# Patient Record
Sex: Female | Born: 1980 | Race: Black or African American | Hispanic: No | Marital: Married | Smoking: Never smoker
Health system: Southern US, Community
[De-identification: ages and names within clinical notes are randomized; demographics above are authoritative.]

## PROBLEM LIST (undated history)

## (undated) ENCOUNTER — Inpatient Hospital Stay (HOSPITAL_COMMUNITY): Payer: Self-pay

## (undated) DIAGNOSIS — D219 Benign neoplasm of connective and other soft tissue, unspecified: Secondary | ICD-10-CM

## (undated) DIAGNOSIS — J45909 Unspecified asthma, uncomplicated: Secondary | ICD-10-CM

## (undated) DIAGNOSIS — D649 Anemia, unspecified: Secondary | ICD-10-CM

## (undated) DIAGNOSIS — K219 Gastro-esophageal reflux disease without esophagitis: Secondary | ICD-10-CM

## (undated) HISTORY — PX: NO PAST SURGERIES: SHX2092

---

## 2005-08-02 ENCOUNTER — Emergency Department (HOSPITAL_COMMUNITY): Admission: EM | Admit: 2005-08-02 | Discharge: 2005-08-02 | Payer: Self-pay | Admitting: Emergency Medicine

## 2008-11-24 ENCOUNTER — Emergency Department (HOSPITAL_COMMUNITY): Admission: EM | Admit: 2008-11-24 | Discharge: 2008-11-24 | Payer: Self-pay | Admitting: Family Medicine

## 2009-08-17 ENCOUNTER — Emergency Department (HOSPITAL_COMMUNITY): Admission: EM | Admit: 2009-08-17 | Discharge: 2009-08-17 | Payer: Self-pay | Admitting: Emergency Medicine

## 2010-03-19 ENCOUNTER — Emergency Department (HOSPITAL_COMMUNITY): Admission: EM | Admit: 2010-03-19 | Discharge: 2010-03-20 | Payer: Self-pay | Admitting: Emergency Medicine

## 2010-09-06 LAB — POCT URINALYSIS DIP (DEVICE)
Glucose, UA: NEGATIVE mg/dL
Hgb urine dipstick: NEGATIVE
Ketones, ur: 40 mg/dL — AB
Nitrite: NEGATIVE
Protein, ur: 30 mg/dL — AB
Specific Gravity, Urine: >=1.03 (ref 1.005–1.030)
Urobilinogen, UA: 0.2 mg/dL (ref 0.0–1.0)
pH: 5 (ref 5.0–8.0)

## 2010-09-06 LAB — POCT PREGNANCY, URINE: Preg Test, Ur: NEGATIVE

## 2011-03-08 ENCOUNTER — Other Ambulatory Visit (HOSPITAL_COMMUNITY)
Admission: RE | Admit: 2011-03-08 | Discharge: 2011-03-08 | Disposition: A | Discharge status: Home / Self Care | Payer: BC Managed Care – PPO | Source: Ambulatory Visit | Attending: Obstetrics and Gynecology | Admitting: Obstetrics and Gynecology

## 2011-03-08 ENCOUNTER — Other Ambulatory Visit: Payer: Self-pay | Admitting: Nurse Practitioner

## 2011-03-08 DIAGNOSIS — Z01419 Encounter for gynecological examination (general) (routine) without abnormal findings: Secondary | ICD-10-CM | POA: Insufficient documentation

## 2011-03-08 DIAGNOSIS — N760 Acute vaginitis: Secondary | ICD-10-CM | POA: Insufficient documentation

## 2012-10-05 ENCOUNTER — Emergency Department (HOSPITAL_COMMUNITY)
Admission: EM | Admit: 2012-10-05 | Discharge: 2012-10-05 | Disposition: A | Discharge status: Home Health | Payer: BC Managed Care – PPO | Attending: Emergency Medicine | Admitting: Emergency Medicine

## 2012-10-05 ENCOUNTER — Encounter (HOSPITAL_COMMUNITY): Payer: Self-pay | Admitting: Family Medicine

## 2012-10-05 DIAGNOSIS — J45901 Unspecified asthma with (acute) exacerbation: Secondary | ICD-10-CM | POA: Insufficient documentation

## 2012-10-05 DIAGNOSIS — J45909 Unspecified asthma, uncomplicated: Secondary | ICD-10-CM

## 2012-10-05 DIAGNOSIS — R064 Hyperventilation: Secondary | ICD-10-CM

## 2012-10-05 HISTORY — DX: Unspecified asthma, uncomplicated: J45.909

## 2012-10-05 MED ORDER — ALBUTEROL SULFATE (5 MG/ML) 0.5% IN NEBU
5.0000 mg | INHALATION_SOLUTION | Freq: Once | RESPIRATORY_TRACT | Status: AC
  Administered 2012-10-05: 5 mg via RESPIRATORY_TRACT

## 2012-10-05 MED ORDER — ALBUTEROL SULFATE (5 MG/ML) 0.5% IN NEBU
INHALATION_SOLUTION | RESPIRATORY_TRACT | Status: AC
  Filled 2012-10-05: qty 1

## 2012-10-05 MED ORDER — IPRATROPIUM BROMIDE 0.02 % IN SOLN: 0.5000 mg | Freq: Once | RESPIRATORY_TRACT | Status: DC

## 2012-10-05 MED ORDER — IPRATROPIUM BROMIDE 0.02 % IN SOLN
RESPIRATORY_TRACT | Status: AC
  Administered 2012-10-05: 0.5 mg
  Filled 2012-10-05: qty 2.5

## 2012-10-05 MED ORDER — LORAZEPAM 1 MG PO TABS
2.0000 mg | ORAL_TABLET | Freq: Once | ORAL | Status: AC
  Administered 2012-10-05: 2 mg via ORAL
  Filled 2012-10-05: qty 2

## 2012-10-05 MED ORDER — DIAZEPAM 2 MG PO TABS: ORAL_TABLET | ORAL | Status: DC

## 2012-10-05 NOTE — ED Notes (Signed)
Per pt SOB with hx of asthma. Pt not moving any air at triage. Unable to speak but a few words. Pt 100% on RA. Very labored.

## 2012-10-05 NOTE — ED Provider Notes (Signed)
History     CSN: [id number]  Arrival date & time 10/05/12  1308   First MD Initiated Contact with Patient 10/05/12 [phone]      Chief Complaint  Patient presents with  . Asthma    (Consider location/radiation/quality/duration/timing/severity/associated sxs/prior treatment) HPI  32 year old female with a past medical history of asthma presents emergency Department with chief complaint of asthma exacerbation.  Patient denies any root previous hospitalization or intubation.  Her husband initiate history by saying that she has really stressed out over the past few weeks.  They're currently moving.  She works in Training and development officer and her husband feels that she has been under significant stress due to this and is manifesting an asthma exacerbation.  The patient interrupted her nebulizer treatment to let me note that she developed a case of paroxysmal coughing and could not catch her breath this morning.  She used her inhaler several times.  She became extremely short of breath and called her husband to bring her to the hospital.  The patient has a history of seasonal allergies she only uses albuterol inhaler.  She is not on any asthma prevent his head has never been.  She denies any cough, recent upper respiratory infection, fever.  Patient states she is currently nauseated.  Past Medical History  Diagnosis Date  . Asthma     History reviewed. No pertinent past surgical history.  History reviewed. No pertinent family history.  History  Substance Use Topics  . Smoking status: Never Smoker   . Smokeless tobacco: Not on file  . Alcohol Use: No    OB History   Grav Para Term Preterm Abortions TAB SAB Ect Mult Living                  Review of Systems Ten systems reviewed and are negative for acute change, except as noted in the HPI.   Allergies  Review of patient's allergies indicates no known allergies.  Home Medications  No current outpatient prescriptions on file.  BP  116/88  Pulse 76  Resp 22  SpO2 100%  LMP 09/19/2012  Physical Exam Physical Exam  Nursing note and vitals reviewed. Constitutional:  No distress.  Speaks in full sentences, however during exam patient appears to be hyperventilating and bracing the chest causing tachypnea. HENT:  Head: Normocephalic and atraumatic.  Eyes: Conjunctivae normal and EOM are normal. Pupils are equal, round, and reactive to light. No scleral icterus.  Neck: Normal range of motion.  Cardiovascular: Normal rate, regular rhythm and normal heart sounds.  Exam reveals no gallop and no friction rub.   No murmur heard. Pulmonary/Chest: Tachypnea, good air movement.  No wheezing, rales, rhonchi heard.  No accessory muscle. Abdominal: Soft. Bowel sounds are normal. She exhibits no distension and no mass. There is no tenderness. There is no guarding.  Neurological: She is alert and oriented to person, place, and time.  Skin: Skin is warm and dry. She is not diaphoretic.    ED Course  Procedures (including critical care time)  Labs Reviewed - No data to display No results found.   No diagnosis found.    MDM  10:38 AM BP 116/88  Pulse 76  Resp 22  SpO2 100%  LMP 09/19/2012 The patient appears to be hyperventilating.  I do not suspect acute asthma exacerbation.  She is currently getting a nebulizer treatment.  I feel that this is more related to an anxiety attack.  We'll treat the patient with oral Ativan.  Reevaluate shortly    11:48 AM Filed Vitals:   10/05/12 0957 10/05/12 1030 10/05/12 1100  BP: 116/88 132/83 132/79  Pulse: 76 94 81  Resp: 22    SpO2: 100% 100% 97%   On reexamination patient is resting comfortably and breathing easily.  She states she feels much better after treatment and 2 mg of Ativan.  The patient appears safe for discharge.  I will have her followup with her primary care physician.  I am discharging her with a small amount of diazepam for relief of hyperventilation  symptoms.The patient appears reasonably screened and/or stabilized for discharge and I doubt any other medical condition or other [facility] requiring further screening, evaluation, or treatment in the ED at this time prior to discharge.     Arthor Captain, PA-C 10/05/12 1149

## 2012-10-05 NOTE — ED Provider Notes (Signed)
Medical screening examination/treatment/procedure(s) were performed by non-physician practitioner and as supervising physician I was immediately available for consultation/collaboration.    Zyhir Cappella D Sreeja Spies, MD 10/05/12 2139 

## 2013-01-30 ENCOUNTER — Inpatient Hospital Stay (HOSPITAL_COMMUNITY)
Admission: AD | Admit: 2013-01-30 | Discharge: 2013-01-30 | Disposition: A | Discharge status: Home / Self Care | Payer: BC Managed Care – PPO | Source: Ambulatory Visit | Attending: Obstetrics and Gynecology | Admitting: Obstetrics and Gynecology

## 2013-01-30 ENCOUNTER — Inpatient Hospital Stay (HOSPITAL_COMMUNITY): Payer: BC Managed Care – PPO

## 2013-01-30 ENCOUNTER — Encounter (HOSPITAL_COMMUNITY): Payer: Self-pay

## 2013-01-30 DIAGNOSIS — O3410 Maternal care for benign tumor of corpus uteri, unspecified trimester: Secondary | ICD-10-CM | POA: Insufficient documentation

## 2013-01-30 DIAGNOSIS — R109 Unspecified abdominal pain: Secondary | ICD-10-CM | POA: Insufficient documentation

## 2013-01-30 DIAGNOSIS — O99891 Other specified diseases and conditions complicating pregnancy: Secondary | ICD-10-CM | POA: Insufficient documentation

## 2013-01-30 DIAGNOSIS — D259 Leiomyoma of uterus, unspecified: Secondary | ICD-10-CM | POA: Insufficient documentation

## 2013-01-30 HISTORY — DX: Benign neoplasm of connective and other soft tissue, unspecified: D21.9

## 2013-01-30 LAB — URINALYSIS, ROUTINE W REFLEX MICROSCOPIC
Bilirubin Urine: NEGATIVE
Glucose, UA: NEGATIVE mg/dL
Hgb urine dipstick: NEGATIVE
Ketones, ur: 40 mg/dL — AB
Leukocytes, UA: NEGATIVE
Nitrite: NEGATIVE
Protein, ur: NEGATIVE mg/dL
Specific Gravity, Urine: >1.03 — ABNORMAL HIGH (ref 1.005–1.030)
Urobilinogen, UA: 0.2 mg/dL (ref 0.0–1.0)
pH: 6 (ref 5.0–8.0)

## 2013-01-30 LAB — CBC WITH DIFFERENTIAL/PLATELET
Basophils Absolute: 0 10*3/uL (ref 0.0–0.1)
Basophils Relative: 0 % (ref 0–1)
Eosinophils Absolute: 0.2 10*3/uL (ref 0.0–0.7)
Eosinophils Relative: 2 % (ref 0–5)
HCT: 33.5 % — ABNORMAL LOW (ref 36.0–46.0)
Hemoglobin: 11.8 g/dL — ABNORMAL LOW (ref 12.0–15.0)
Lymphocytes Relative: 25 % (ref 12–46)
Lymphs Abs: 2.7 10*3/uL (ref 0.7–4.0)
MCH: 30.1 pg (ref 26.0–34.0)
MCHC: 35.2 g/dL (ref 30.0–36.0)
MCV: 85.5 fL (ref 78.0–100.0)
Monocytes Absolute: 0.7 10*3/uL (ref 0.1–1.0)
Monocytes Relative: 6 % (ref 3–12)
Neutro Abs: 7.5 10*3/uL (ref 1.7–7.7)
Neutrophils Relative %: 68 % (ref 43–77)
Platelets: 366 10*3/uL (ref 150–400)
RBC: 3.92 MIL/uL (ref 3.87–5.11)
RDW: 13 % (ref 11.5–15.5)
WBC: 11 10*3/uL — ABNORMAL HIGH (ref 4.0–10.5)

## 2013-01-30 LAB — POCT PREGNANCY, URINE: Preg Test, Ur: POSITIVE — AB

## 2013-01-30 LAB — ABO/RH: ABO/RH(D): O POS

## 2013-01-30 LAB — HCG, QUANTITATIVE, PREGNANCY: hCG, Beta Chain, Quant, S: 4480 m[IU]/mL — ABNORMAL HIGH

## 2013-01-30 MED ORDER — HYDROMORPHONE HCL PF 1 MG/ML IJ SOLN
1.0000 mg | INTRAMUSCULAR | Status: AC
  Administered 2013-01-30: 1 mg via INTRAMUSCULAR

## 2013-01-30 MED ORDER — HYDROMORPHONE HCL PF 1 MG/ML IJ SOLN
INTRAMUSCULAR | Status: AC
  Filled 2013-01-30: qty 1

## 2013-01-30 NOTE — MAU Provider Note (Signed)
  History     CSN: [id number]  Arrival date and time: 01/30/13 1140   None     Chief Complaint  Patient presents with  . Abdominal Pain   HPI Comments: Natasha Carson 32 y.o. Is a patient of Dr Bing Ree who was having mild pain in abdomen last night when she went to bed. During the night she awake every 3 hours with sharp pains and this morning called Dr Richardson Dopp who advised she go to MAU. Upon arrival she is obvious pain. States it is 8/10 and sharp stabbing pain in left lower quad. She took tylenol last night.     Abdominal Pain      Past Medical History  Diagnosis Date  . Asthma     No past surgical history on file.  No family history on file.  History  Substance Use Topics  . Smoking status: Never Smoker   . Smokeless tobacco: Not on file  . Alcohol Use: No    Allergies: No Known Allergies  Prescriptions prior to admission  Medication Sig Dispense Refill  . diazepam (VALIUM) 2 MG tablet Take one tablet every 6 hours as needed for hyperventilation or anxiety.  10 tablet  0    Review of Systems  Constitutional:       Appears in pain  HENT: Negative.   Eyes: Negative.   Respiratory: Negative.   Cardiovascular: Negative.   Gastrointestinal: Positive for abdominal pain.  Musculoskeletal: Negative.   Skin: Negative.   Neurological: Negative.   Psychiatric/Behavioral: The patient is nervous/anxious.    Physical Exam   Blood pressure 128/93, pulse 130, temperature 99.2 F (37.3 C), temperature source Oral, resp. rate 18, height 5' 1.5" (1.562 m), weight 96.616 kg (213 lb), last menstrual period 12/24/2012.  Physical Exam  Constitutional: She is oriented to person, place, and time. She appears well-developed and well-nourished. She appears distressed.  HENT:  Head: Normocephalic and atraumatic.  Eyes: Pupils are equal, round, and reactive to light.  Cardiovascular: Normal rate, regular rhythm and normal heart sounds.   Respiratory: Effort normal. No  respiratory distress. She has no wheezes. She has no rales. She exhibits no tenderness.  GI: Bowel sounds are normal. She exhibits distension. There is tenderness.  Musculoskeletal: Normal range of motion.  Neurological: She is alert and oriented to person, place, and time.  Skin: Skin is warm and dry.  Psychiatric:  Anxious an in pain    MAU Course  Procedures  MDM Called Dr Richardson Dopp who is on her way Cbc, ABORH, BHCG  U/S  Dilaudid 1 mg IM  Assessment and Plan  Dr Richardson Dopp to assume care at 12:55/ Pt is still in U/S  Carolynn Serve 01/30/2013, 12:18 PM

## 2013-01-30 NOTE — H&P (Signed)
Natasha Carson is an 32 y.o. female. At 5 wks and 2 days ega  By LMP  who was having mild pain in abdomen last night when she went to bed. During the night she awake every 3 hours with sharp pains and this morning . Upon arrival she is obvious pain per the NP Pain was  8/10 and sharp stabbing pain in left lower quad. She took tylenol last night.  She received 1 mg of Dilaudid .Marland Kitchen Reports pain is currently 2-3 out of 10. She denies vaginal bleeding.    Menstrual History: Patient's last menstrual period was 12/24/2012.   Past Medical History  Diagnosis Date  . Asthma   . Fibroid     History reviewed. No pertinent past surgical history.  History reviewed. No pertinent family history.  Social History:  reports that she has never smoked. She does not have any smokeless tobacco history on file. She reports that she does not drink alcohol or use illicit drugs.  Allergies: No Known Allergies  Prescriptions prior to admission  Medication Sig Dispense Refill  . acetaminophen (TYLENOL) 500 MG tablet Take 1,000 mg by mouth every 6 (six) hours as needed for pain.      Marland Kitchen albuterol (PROVENTIL HFA;VENTOLIN HFA) 108 (90 BASE) MCG/ACT inhaler Inhale 2 puffs into the lungs every 6 (six) hours as needed for wheezing.      . Prenatal Vit-Fe Fumarate-FA (PRENATAL MULTIVITAMIN) TABS tablet Take 1 tablet by mouth daily at 12 noon.        ROS  Blood pressure 116/60, pulse 95, temperature 99.2 F (37.3 C), temperature source Oral, resp. rate 16, height 5' 1.5" (1.562 m), weight 96.616 kg (213 lb), last menstrual period 12/24/2012. Physical Exam  Nursing note and vitals reviewed. Constitutional: She is oriented to person, place, and time. She appears well-developed and well-nourished. No distress.  Cardiovascular: Normal rate and regular rhythm.   Respiratory: Effort normal.  GI: Soft. There is tenderness. There is no rebound and no guarding.  Musculoskeletal: Normal range of motion.  Neurological:  She is alert and oriented to person, place, and time.  Skin: Skin is warm and dry.  Psychiatric: She has a normal mood and affect.    Results for orders placed during the hospital encounter of 01/30/13 (from the past 24 hour(s))  URINALYSIS, ROUTINE W REFLEX MICROSCOPIC     Status: Abnormal   Collection Time    01/30/13 11:50 AM      Result Value Range   Color, Urine YELLOW  YELLOW   APPearance CLOUDY (*) CLEAR   Specific Gravity, Urine >1.030 (*) 1.005 - 1.030   pH 6.0  5.0 - 8.0   Glucose, UA NEGATIVE  NEGATIVE mg/dL   Hgb urine dipstick NEGATIVE  NEGATIVE   Bilirubin Urine NEGATIVE  NEGATIVE   Ketones, ur 40 (*) NEGATIVE mg/dL   Protein, ur NEGATIVE  NEGATIVE mg/dL   Urobilinogen, UA 0.2  0.0 - 1.0 mg/dL   Nitrite NEGATIVE  NEGATIVE   Leukocytes, UA NEGATIVE  NEGATIVE  POCT PREGNANCY, URINE     Status: Abnormal   Collection Time    01/30/13 11:54 AM      Result Value Range   Preg Test, Ur POSITIVE (*) NEGATIVE  ABO/RH     Status: None   Collection Time    01/30/13 12:11 PM      Result Value Range   ABO/RH(D) O POS    CBC WITH DIFFERENTIAL     Status: Abnormal  Collection Time    01/30/13 12:12 PM      Result Value Range   WBC 11.0 (*) 4.0 - 10.5 K/uL   RBC 3.92  3.87 - 5.11 MIL/uL   Hemoglobin 11.8 (*) 12.0 - 15.0 g/dL   HCT 16.1 (*) 09.6 - 04.5 %   MCV 85.5  78.0 - 100.0 fL   MCH 30.1  26.0 - 34.0 pg   MCHC 35.2  30.0 - 36.0 g/dL   RDW 40.9  81.1 - 91.4 %   Platelets 366  150 - 400 K/uL   Neutrophils Relative % 68  43 - 77 %   Neutro Abs 7.5  1.7 - 7.7 K/uL   Lymphocytes Relative 25  12 - 46 %   Lymphs Abs 2.7  0.7 - 4.0 K/uL   Monocytes Relative 6  3 - 12 %   Monocytes Absolute 0.7  0.1 - 1.0 K/uL   Eosinophils Relative 2  0 - 5 %   Eosinophils Absolute 0.2  0.0 - 0.7 K/uL   Basophils Relative 0  0 - 1 %   Basophils Absolute 0.0  0.0 - 0.1 K/uL  HCG, QUANTITATIVE, PREGNANCY     Status: Abnormal   Collection Time    01/30/13 12:12 PM      Result Value  Range   hCG, Beta Chain, Quant, S 4480 (*) <5 mIU/mL    US Ob Comp Less 14 Wks  01/30/2013   OBSTETRICAL ULTRASOUND: This exam was performed within a [place] [facility] Department. The OB US report was generated in the AS system, and faxed to the ordering physician.   This report is also available in [employer] and in the [employer]. See AS Obstetric US report.  US Ob Transvaginal  01/30/2013   OBSTETRICAL ULTRASOUND: This exam was performed within a [place] [facility] Department. The OB US report was generated in the AS system, and faxed to the ordering physician.   This report is also available in [employer] and in the [employer]. See AS Obstetric US report.   ultrasound was reviewed pt has multiple uterine fibroids the largest is 8 cm... She has a probably early Intrauterine gestational sac. No yolk sac seen.  No adnexal masses no free fluid   Assessment/Plan:  first trimester pelvic pain.. May be due to uterine fibroids. Per ultrasoudn probable early IUP will follow seria. hcg and repeat ultrasound in 7-10 days.  NOrco for pain 1-2 q 4 hour prn.  Pt given precautions to return to ER or call if pain is severe and uncontrolled.   Cambri Plourde J. 01/30/2013, 3:02 PM

## 2013-01-30 NOTE — Progress Notes (Signed)
Patient crying. States that she is afraid she is going to lose the baby. States they have been trying for a while to get pregnant.

## 2013-01-30 NOTE — MAU Note (Signed)
Patient states she has had a positive pregnancy test in the office. Started having left side pain last night that radiates to the left back and has gotten worse this am. Denies bleeding discharge.

## 2013-01-30 NOTE — MAU Note (Signed)
Pain started around 6pm yesterday. Took a Tylenol and then pain came back around 2 or 3 in the morning. Has been hurting since and getting progressively worse.

## 2013-01-31 ENCOUNTER — Inpatient Hospital Stay (HOSPITAL_COMMUNITY)
Admission: AD | Admit: 2013-01-31 | Discharge: 2013-01-31 | Disposition: A | Discharge status: Home / Self Care | Payer: BC Managed Care – PPO | Source: Ambulatory Visit | Attending: Obstetrics and Gynecology | Admitting: Obstetrics and Gynecology

## 2013-01-31 ENCOUNTER — Encounter (HOSPITAL_COMMUNITY): Payer: Self-pay | Admitting: *Deleted

## 2013-01-31 ENCOUNTER — Inpatient Hospital Stay (HOSPITAL_COMMUNITY): Payer: BC Managed Care – PPO

## 2013-01-31 DIAGNOSIS — O26899 Other specified pregnancy related conditions, unspecified trimester: Secondary | ICD-10-CM

## 2013-01-31 DIAGNOSIS — R1032 Left lower quadrant pain: Secondary | ICD-10-CM | POA: Insufficient documentation

## 2013-01-31 DIAGNOSIS — O9989 Other specified diseases and conditions complicating pregnancy, childbirth and the puerperium: Secondary | ICD-10-CM

## 2013-01-31 DIAGNOSIS — O99891 Other specified diseases and conditions complicating pregnancy: Secondary | ICD-10-CM | POA: Insufficient documentation

## 2013-01-31 MED ORDER — HYDROMORPHONE HCL PF 1 MG/ML IJ SOLN
1.0000 mg | INTRAMUSCULAR | Status: AC
  Administered 2013-01-31: 1 mg via INTRAMUSCULAR

## 2013-01-31 MED ORDER — HYDROMORPHONE HCL PF 1 MG/ML IJ SOLN
1.0000 mg | INTRAMUSCULAR | Status: DC
  Filled 2013-01-31: qty 1

## 2013-01-31 MED ORDER — ONDANSETRON 8 MG PO TBDP
8.0000 mg | ORAL_TABLET | Freq: Once | ORAL | Status: AC
  Administered 2013-01-31: 8 mg via ORAL
  Filled 2013-01-31: qty 1

## 2013-01-31 NOTE — MAU Note (Signed)
US at the bedside

## 2013-01-31 NOTE — MAU Note (Signed)
Pt had gone to the restroom after CNM in to discuss d/c and plan of care.  Pt became nauseated, threw up small amt of bile. Somewhat dizzy with experience.  Comfort measures: cool wet cloth, wc assist to return to rm.

## 2013-01-31 NOTE — MAU Note (Signed)
Pt asking about pain med rx from yesterday, had not picked it up, wondering if it will make her sick; frequently gets sick from pain medicine.

## 2013-01-31 NOTE — MAU Note (Signed)
Pt arrived very uncomfortable with LLQ abd pain.  Felt a "pop" in the RLQ area and pain on left side became very intense.  No bleeding.

## 2013-01-31 NOTE — MAU Provider Note (Signed)
Chief Complaint: Abdominal Pain   None    SUBJECTIVE HPI: Natasha Carson is a 32 y.o. G2P1001 at [redacted]w[redacted]d by LMP who presents to maternity admissions reporting she woke up at 3 am, felt a "pop" and then had increased abdominal pain, more on the left lower side.  She was seen earlier on 8/20 for abdominal pain in early pregnancy and had labs and  U/S.  Quant hcg was 4480 and U/s showed possible gestational sac, with no yolk sac, or visible ectopic pregnancy and multiple uterine fibroids measuring in size ranging from 2x2x2 to 6x6x8.  She denies vaginal bleeding, vaginal itching/burning, urinary symptoms, h/a, dizziness, n/v, or fever/chills.     Past Medical History  Diagnosis Date  . Asthma   . Fibroid    Past Surgical History  Procedure Laterality Date  . No past surgeries     History   Social History  . Marital Status: Married    Spouse Name: N/A    Number of Children: N/A  . Years of Education: N/A   Occupational History  . Not on file.   Social History Main Topics  . Smoking status: Never Smoker   . Smokeless tobacco: Not on file  . Alcohol Use: No  . Drug Use: No  . Sexual Activity: Yes    Birth Control/ Protection: None   Other Topics Concern  . Not on file   Social History Narrative  . No narrative on file   No current facility-administered medications on file prior to encounter.   Current Outpatient Prescriptions on File Prior to Encounter  Medication Sig Dispense Refill  . acetaminophen (TYLENOL) 500 MG tablet Take 1,000 mg by mouth every 6 (six) hours as needed for pain.      . Prenatal Vit-Fe Fumarate-FA (PRENATAL MULTIVITAMIN) TABS tablet Take 1 tablet by mouth daily at 12 noon.      Marland Kitchen albuterol (PROVENTIL HFA;VENTOLIN HFA) 108 (90 BASE) MCG/ACT inhaler Inhale 2 puffs into the lungs every 6 (six) hours as needed for wheezing.       No Known Allergies  ROS: Pertinent items in HPI  OBJECTIVE Blood pressure 121/66, pulse 103, temperature 97.6 F (36.4  C), temperature source Oral, resp. rate 22, height 5\' 1"  (1.549 m), weight 96.616 kg (213 lb), last menstrual period 12/24/2012. GENERAL: Well-developed, well-nourished female in no acute distress.  HEENT: Normocephalic HEART: normal rate RESP: normal effort ABDOMEN: Soft, non-tender EXTREMITIES: Nontender, no edema NEURO: Alert and oriented SPECULUM EXAM: Deferred--done 01/30/13  LAB RESULTS Results for orders placed during the hospital encounter of 01/30/13 (from the past 24 hour(s))  URINALYSIS, ROUTINE W REFLEX MICROSCOPIC     Status: Abnormal   Collection Time    01/30/13 11:50 AM      Result Value Range   Color, Urine YELLOW  YELLOW   APPearance CLOUDY (*) CLEAR   Specific Gravity, Urine >1.030 (*) 1.005 - 1.030   pH 6.0  5.0 - 8.0   Glucose, UA NEGATIVE  NEGATIVE mg/dL   Hgb urine dipstick NEGATIVE  NEGATIVE   Bilirubin Urine NEGATIVE  NEGATIVE   Ketones, ur 40 (*) NEGATIVE mg/dL   Protein, ur NEGATIVE  NEGATIVE mg/dL   Urobilinogen, UA 0.2  0.0 - 1.0 mg/dL   Nitrite NEGATIVE  NEGATIVE   Leukocytes, UA NEGATIVE  NEGATIVE  POCT PREGNANCY, URINE     Status: Abnormal   Collection Time    01/30/13 11:54 AM      Result Value Range   Preg  Test, Ur POSITIVE (*) NEGATIVE  ABO/RH     Status: None   Collection Time    01/30/13 12:11 PM      Result Value Range   ABO/RH(D) O POS    CBC WITH DIFFERENTIAL     Status: Abnormal   Collection Time    01/30/13 12:12 PM      Result Value Range   WBC 11.0 (*) 4.0 - 10.5 K/uL   RBC 3.92  3.87 - 5.11 MIL/uL   Hemoglobin 11.8 (*) 12.0 - 15.0 g/dL   HCT 40.9 (*) 81.1 - 91.4 %   MCV 85.5  78.0 - 100.0 fL   MCH 30.1  26.0 - 34.0 pg   MCHC 35.2  30.0 - 36.0 g/dL   RDW 78.2  95.6 - 21.3 %   Platelets 366  150 - 400 K/uL   Neutrophils Relative % 68  43 - 77 %   Neutro Abs 7.5  1.7 - 7.7 K/uL   Lymphocytes Relative 25  12 - 46 %   Lymphs Abs 2.7  0.7 - 4.0 K/uL   Monocytes Relative 6  3 - 12 %   Monocytes Absolute 0.7  0.1 - 1.0 K/uL    Eosinophils Relative 2  0 - 5 %   Eosinophils Absolute 0.2  0.0 - 0.7 K/uL   Basophils Relative 0  0 - 1 %   Basophils Absolute 0.0  0.0 - 0.1 K/uL  HCG, QUANTITATIVE, PREGNANCY     Status: Abnormal   Collection Time    01/30/13 12:12 PM      Result Value Range   hCG, Beta Chain, Quant, S 4480 (*) <5 mIU/mL    IMAGING US Ob Comp Less 14 Wks  01/31/2013   *RADIOLOGY REPORT*  Clinical Data: Severe abdominal pain.  Previously scanned yesterday.  Estimated gestational age by LMP is 5 weeks 2 days. Quantitative beta HCG is 4480  OBSTETRIC <14 WK [place] AND TRANSVAGINAL OB US  Technique:  Both transabdominal and transvaginal ultrasound examinations were performed for complete evaluation of the gestation as well as the maternal uterus, adnexal regions, and pelvic cul-de-sac.  Transvaginal technique was performed to assess early pregnancy.  Comparison:  01/30/2013  Intrauterine gestational sac:  No focal intrauterine cystic collection is identified, likely representing an intrauterine gestational sac. Yolk sac: Not visualized Embryo: Not visualized Cardiac Activity: Not visualized  MSD: 0.82  mm  5 w 3 d        [place] EDC: 09/30/2013  Maternal uterus/adnexae: Multiple large uterine fibroids are present, limiting visualization of the endometrium.  Focal fibroids are demonstrated measuring 5.3 x 4.8 x 5.6 cm anteriorly, 8.4 x 7.2 x 6.3 cm posteriorly, and 2.9 x 2.6 x 2.4 cm in the fundus.  Both ovaries are visualized without abnormal adnexal mass identified.  Stable appearance since previous study.  IMPRESSION: Probable early intrauterine gestational sac, but no yolk sac, fetal pole, or cardiac activity yet visualized.  Recommend follow-up quantitative B-HCG levels and follow-up US in 14 days to confirm and assess viability.   Original Report Authenticated By: Burman Nieves, M.D.   US Ob Comp Less 14 Wks  01/30/2013   OBSTETRICAL ULTRASOUND: This exam was performed within a  Ultrasound Department. The  OB US report was generated in the AS system, and faxed to the ordering physician.   This report is also available in [employer] and in the [employer]. See AS Obstetric US report.  US Ob Transvaginal  01/31/2013   *RADIOLOGY REPORT*  Clinical Data: Severe abdominal pain.  Previously scanned yesterday.  Estimated gestational age by LMP is 5 weeks 2 days. Quantitative beta HCG is 4480  OBSTETRIC <14 WK [place] AND TRANSVAGINAL OB US  Technique:  Both transabdominal and transvaginal ultrasound examinations were performed for complete evaluation of the gestation as well as the maternal uterus, adnexal regions, and pelvic cul-de-sac.  Transvaginal technique was performed to assess early pregnancy.  Comparison:  01/30/2013  Intrauterine gestational sac:  No focal intrauterine cystic collection is identified, likely representing an intrauterine gestational sac. Yolk sac: Not visualized Embryo: Not visualized Cardiac Activity: Not visualized  MSD: 0.82  mm  5 w 3 d        [place] EDC: 09/30/2013  Maternal uterus/adnexae: Multiple large uterine fibroids are present, limiting visualization of the endometrium.  Focal fibroids are demonstrated measuring 5.3 x 4.8 x 5.6 cm anteriorly, 8.4 x 7.2 x 6.3 cm posteriorly, and 2.9 x 2.6 x 2.4 cm in the fundus.  Both ovaries are visualized without abnormal adnexal mass identified.  Stable appearance since previous study.  IMPRESSION: Probable early intrauterine gestational sac, but no yolk sac, fetal pole, or cardiac activity yet visualized.  Recommend follow-up quantitative B-HCG levels and follow-up US in 14 days to confirm and assess viability.   Original Report Authenticated By: Burman Nieves, M.D.   US Ob Transvaginal  01/30/2013   OBSTETRICAL ULTRASOUND: This exam was performed within a [place] [facility]trasound Department. The OB US report was generated in the AS system, and faxed to the ordering physician.   This report is also available in [employer] and in the [employer]. See AS Obstetric US report.    ASSESSMENT 1. Pregnancy with abdominal pain of left lower quadrant, antepartum     PLAN Consult with Dr Richardson Dopp Discharge home with ectopic precautions Take Norco called to pt pharmacy yesterday as prescribed Return to MAU tomorrow for repeat quant hcg   Sharen Counter Certified Nurse-Midwife 01/31/2013  5:53 AM

## 2013-02-01 ENCOUNTER — Inpatient Hospital Stay (HOSPITAL_COMMUNITY)
Admission: AD | Admit: 2013-02-01 | Discharge: 2013-02-01 | Disposition: A | Discharge status: Home / Self Care | Payer: BC Managed Care – PPO | Source: Ambulatory Visit | Attending: Obstetrics and Gynecology | Admitting: Obstetrics and Gynecology

## 2013-02-01 DIAGNOSIS — O3410 Maternal care for benign tumor of corpus uteri, unspecified trimester: Secondary | ICD-10-CM | POA: Insufficient documentation

## 2013-02-01 DIAGNOSIS — Z09 Encounter for follow-up examination after completed treatment for conditions other than malignant neoplasm: Secondary | ICD-10-CM

## 2013-02-01 DIAGNOSIS — O99891 Other specified diseases and conditions complicating pregnancy: Secondary | ICD-10-CM | POA: Insufficient documentation

## 2013-02-01 DIAGNOSIS — D259 Leiomyoma of uterus, unspecified: Secondary | ICD-10-CM | POA: Insufficient documentation

## 2013-02-01 LAB — HCG, QUANTITATIVE, PREGNANCY: hCG, Beta Chain, Quant, S: 4800 m[IU]/mL — ABNORMAL HIGH (ref <5)

## 2013-02-01 NOTE — MAU Note (Signed)
Pt states here for repeat BHCG, only having slight twinges of pain now, much less pain than last visit here.

## 2013-02-01 NOTE — MAU Provider Note (Signed)
  History     CSN: [id number]  Arrival date and time: 02/01/13 1128   None     Chief Complaint  Patient presents with  . Follow up BHCG    HPI Natasha Carson is 32 y.o. G2P1001 [redacted]w[redacted]d weeks presenting for follow up BHCG>  She was initially seen in MAU by Dr. Richardson Dopp on 8/20 with pain in early pregnancy.  On that date, BHCG 4480 and U/S showed probably IUGS, no YS.  3 large fibroids posteriorly and fundal.  She returned the next day with report of a "pop" sound.  U/S was repeated with no change--less than 24 hrs apart.  She reports pain is much less today, only a "twinge".  Denies vaginal bleeding.  Past Medical History  Diagnosis Date  . Asthma   . Fibroid     Past Surgical History  Procedure Laterality Date  . No past surgeries      No family history on file.  History  Substance Use Topics  . Smoking status: Never Smoker   . Smokeless tobacco: Not on file  . Alcohol Use: No    Allergies: No Known Allergies  Prescriptions prior to admission  Medication Sig Dispense Refill  . acetaminophen (TYLENOL) 500 MG tablet Take 1,000 mg by mouth every 6 (six) hours as needed for pain.      Marland Kitchen albuterol (PROVENTIL HFA;VENTOLIN HFA) 108 (90 BASE) MCG/ACT inhaler Inhale 2 puffs into the lungs every 6 (six) hours as needed for wheezing.      . Prenatal Vit-Fe Fumarate-FA (PRENATAL MULTIVITAMIN) TABS tablet Take 1 tablet by mouth daily at 12 noon.        Review of Systems  Gastrointestinal: Positive for abdominal pain (much less- only twinge).  Genitourinary:       Neg for vaginal bleeding   Physical Exam   Blood pressure 120/77, pulse 94, temperature 98.4 F (36.9 C), temperature source Oral, resp. rate 16, height 5\' 1"  (1.549 m), weight 208 lb 6 oz (94.518 kg), last menstrual period 12/24/2012.  Physical Exam  Constitutional: She is oriented to person, place, and time. She appears well-developed and well-nourished. No distress.  Neurological: She is alert and oriented to  person, place, and time.  Psychiatric: She has a normal mood and affect. Her behavior is normal.    Results for orders placed during the hospital encounter of 02/01/13 (from the past 24 hour(s))  HCG, QUANTITATIVE, PREGNANCY     Status: Abnormal   Collection Time    02/01/13 11:56 AM      Result Value Range   hCG, Beta Chain, Quant, S 4800 (*) <5 mIU/mL   MAU Course  Procedures  MDM Discussed patient's history and labs with Dr. Dion Body.  Discharge to home and keep the appt she has scheduled for next week for repeat BHCG/U/S in office.  Dr. Dion Body on call this weekend if patient should need her.  Assessment and Plan  A;  Early pregnant with inappropriate rise in BHCG      Known fibroids  P:  Patient instructed to keep scheduled appt with Dr. Richardson Dopp for 8/28.       Call MD for worsening sxs  Siren Porrata,EVE M 02/01/2013, 1:11 PM

## 2013-02-27 ENCOUNTER — Other Ambulatory Visit: Payer: Self-pay | Admitting: Obstetrics and Gynecology

## 2013-02-27 ENCOUNTER — Other Ambulatory Visit (HOSPITAL_COMMUNITY)
Admission: RE | Admit: 2013-02-27 | Discharge: 2013-02-27 | Disposition: A | Discharge status: Home / Self Care | Payer: BC Managed Care – PPO | Source: Ambulatory Visit | Attending: Obstetrics and Gynecology | Admitting: Obstetrics and Gynecology

## 2013-02-27 DIAGNOSIS — Z113 Encounter for screening for infections with a predominantly sexual mode of transmission: Secondary | ICD-10-CM | POA: Insufficient documentation

## 2013-02-27 DIAGNOSIS — Z01419 Encounter for gynecological examination (general) (routine) without abnormal findings: Secondary | ICD-10-CM | POA: Insufficient documentation

## 2013-02-27 DIAGNOSIS — Z1151 Encounter for screening for human papillomavirus (HPV): Secondary | ICD-10-CM | POA: Insufficient documentation

## 2013-02-27 LAB — OB RESULTS CONSOLE GC/CHLAMYDIA
Chlamydia: NEGATIVE
Gonorrhea: NEGATIVE

## 2013-03-14 ENCOUNTER — Encounter (HOSPITAL_COMMUNITY): Payer: Self-pay | Admitting: *Deleted

## 2013-03-14 ENCOUNTER — Inpatient Hospital Stay (HOSPITAL_COMMUNITY)
Admission: AD | Admit: 2013-03-14 | Discharge: 2013-03-14 | Disposition: A | Discharge status: Home / Self Care | Payer: BC Managed Care – PPO | Source: Ambulatory Visit | Attending: Obstetrics and Gynecology | Admitting: Obstetrics and Gynecology

## 2013-03-14 DIAGNOSIS — O26859 Spotting complicating pregnancy, unspecified trimester: Secondary | ICD-10-CM | POA: Insufficient documentation

## 2013-03-14 DIAGNOSIS — O4690 Antepartum hemorrhage, unspecified, unspecified trimester: Secondary | ICD-10-CM

## 2013-03-14 DIAGNOSIS — R109 Unspecified abdominal pain: Secondary | ICD-10-CM | POA: Insufficient documentation

## 2013-03-14 DIAGNOSIS — O209 Hemorrhage in early pregnancy, unspecified: Secondary | ICD-10-CM

## 2013-03-14 LAB — URINALYSIS, ROUTINE W REFLEX MICROSCOPIC
Bilirubin Urine: NEGATIVE
Glucose, UA: NEGATIVE mg/dL
Ketones, ur: 15 mg/dL — AB
Nitrite: NEGATIVE
Protein, ur: NEGATIVE mg/dL
Specific Gravity, Urine: 1.015 (ref 1.005–1.030)
Urobilinogen, UA: 0.2 mg/dL (ref 0.0–1.0)
pH: 6.5 (ref 5.0–8.0)

## 2013-03-14 LAB — URINE MICROSCOPIC-ADD ON

## 2013-03-14 NOTE — MAU Note (Signed)
Patient presents to MAU with c/o of lower abdominal cramping and bleeding that is spotted; Started today. States this am bleeding was brown and has progressed to red.

## 2013-03-14 NOTE — MAU Provider Note (Signed)
  History     CSN: [id number]  Arrival date and time: 03/14/13 1218   First Provider Initiated Contact with Patient 03/14/13 1306      Chief Complaint  Patient presents with  . Vaginal Bleeding  . Abdominal Cramping   HPI This is a 32 y.o. female at [redacted]w[redacted]d who presents with c/o cramping and bleeding since last night. Was brown and turned red.  Has had an [place] in office since her 5+ week [place] here.   RN Note: Patient presents to MAU with c/o of lower abdominal cramping and bleeding that is spotted; Started today. States this am bleeding was brown and has progressed to red.        OB History   Grav Para Term Preterm Abortions TAB SAB Ect Mult Living   2 1 1       1       Past Medical History  Diagnosis Date  . Asthma   . Fibroid     Past Surgical History  Procedure Laterality Date  . No past surgeries      History reviewed. No pertinent family history.  History  Substance Use Topics  . Smoking status: Never Smoker   . Smokeless tobacco: Not on file  . Alcohol Use: No    Allergies: No Known Allergies  Prescriptions prior to admission  Medication Sig Dispense Refill  . acetaminophen (TYLENOL) 500 MG tablet Take 1,000 mg by mouth every 6 (six) hours as needed for pain.      Marland Kitchen albuterol (PROVENTIL HFA;VENTOLIN HFA) 108 (90 BASE) MCG/ACT inhaler Inhale 2 puffs into the lungs every 6 (six) hours as needed for wheezing.      . Prenatal Vit-Fe Fumarate-FA (PRENATAL MULTIVITAMIN) TABS tablet Take 1 tablet by mouth daily at 12 noon.        Review of Systems  Constitutional: Negative for fever, chills and malaise/fatigue.  Gastrointestinal: Negative for nausea, vomiting, abdominal pain, diarrhea and constipation.  Genitourinary:       Spotting at home    Physical Exam   Blood pressure 125/79, pulse 88, temperature 98.5 F (36.9 C), temperature source Oral, resp. rate 18, height 5\' 1"  (1.549 m), weight 93.532 kg (206 lb 3.2 oz), last menstrual period 12/24/2012, SpO2  100.00%.  Physical Exam  Constitutional: She is oriented to person, place, and time. She appears well-developed and well-nourished. No distress.  HENT:  Head: Normocephalic.  Cardiovascular: Normal rate.   Respiratory: Effort normal.  GI: Soft. She exhibits no distension. There is no tenderness. There is no rebound and no guarding.  Genitourinary: Uterus normal. Vaginal discharge (very light pink discharge ) found.  Musculoskeletal: Normal range of motion.  Neurological: She is alert and oriented to person, place, and time.  Skin: Skin is warm and dry.  Psychiatric: She has a normal mood and affect.   Fetal heart tones at 160  MAU Course  Procedures  Assessment and Plan  A:  SIUP at [redacted]w[redacted]d       Spotting early pregnancy  P:  Discussed with patient and Dr Richardson Dopp       Discussed expectant management and reassured of good fetal status.   Wynelle Bourgeois 03/14/2013, 1:21 PM

## 2013-05-13 ENCOUNTER — Inpatient Hospital Stay (HOSPITAL_COMMUNITY)
Admission: AD | Admit: 2013-05-13 | Discharge: 2013-05-13 | Disposition: A | Discharge status: Home / Self Care | Payer: BC Managed Care – PPO | Source: Ambulatory Visit | Attending: Obstetrics and Gynecology | Admitting: Obstetrics and Gynecology

## 2013-05-13 ENCOUNTER — Encounter (HOSPITAL_COMMUNITY): Payer: Self-pay | Admitting: *Deleted

## 2013-05-13 DIAGNOSIS — D259 Leiomyoma of uterus, unspecified: Secondary | ICD-10-CM | POA: Insufficient documentation

## 2013-05-13 DIAGNOSIS — M545 Low back pain, unspecified: Secondary | ICD-10-CM | POA: Insufficient documentation

## 2013-05-13 DIAGNOSIS — R109 Unspecified abdominal pain: Secondary | ICD-10-CM | POA: Insufficient documentation

## 2013-05-13 DIAGNOSIS — IMO0001 Reserved for inherently not codable concepts without codable children: Secondary | ICD-10-CM | POA: Insufficient documentation

## 2013-05-13 DIAGNOSIS — M7918 Myalgia, other site: Secondary | ICD-10-CM

## 2013-05-13 DIAGNOSIS — O3410 Maternal care for benign tumor of corpus uteri, unspecified trimester: Secondary | ICD-10-CM | POA: Insufficient documentation

## 2013-05-13 DIAGNOSIS — O3412 Maternal care for benign tumor of corpus uteri, second trimester: Secondary | ICD-10-CM

## 2013-05-13 LAB — URINALYSIS, ROUTINE W REFLEX MICROSCOPIC
Glucose, UA: NEGATIVE mg/dL
Ketones, ur: >80 mg/dL — AB
Leukocytes, UA: NEGATIVE
Nitrite: NEGATIVE
Protein, ur: 30 mg/dL — AB
Specific Gravity, Urine: >1.03 — ABNORMAL HIGH (ref 1.005–1.030)
Urobilinogen, UA: 1 mg/dL (ref 0.0–1.0)
pH: 6 (ref 5.0–8.0)

## 2013-05-13 LAB — URINE MICROSCOPIC-ADD ON

## 2013-05-13 MED ORDER — HYDROCODONE-ACETAMINOPHEN 5-325 MG PO TABS
2.0000 | ORAL_TABLET | ORAL | Status: AC
  Administered 2013-05-13: 2 via ORAL
  Filled 2013-05-13: qty 2

## 2013-05-13 MED ORDER — HYDROCODONE-ACETAMINOPHEN 5-325 MG PO TABS: 1.0000 | ORAL_TABLET | Freq: Four times a day (QID) | ORAL | Status: DC | PRN

## 2013-05-13 MED ORDER — CYCLOBENZAPRINE HCL 10 MG PO TABS: 5.0000 mg | ORAL_TABLET | Freq: Three times a day (TID) | ORAL | Status: DC | PRN

## 2013-05-13 MED ORDER — LACTATED RINGERS IV BOLUS (SEPSIS)
1000.0000 mL | Freq: Once | INTRAVENOUS | Status: AC
  Administered 2013-05-13: 1000 mL via INTRAVENOUS

## 2013-05-13 MED ORDER — DEXTROSE 5 % IN LACTATED RINGERS IV BOLUS
1000.0000 mL | Freq: Once | INTRAVENOUS | Status: AC
  Administered 2013-05-13: 1000 mL via INTRAVENOUS

## 2013-05-13 MED ORDER — CYCLOBENZAPRINE HCL 10 MG PO TABS
10.0000 mg | ORAL_TABLET | Freq: Three times a day (TID) | ORAL | Status: DC | PRN
  Administered 2013-05-13: 10 mg via ORAL
  Filled 2013-05-13: qty 1

## 2013-05-13 NOTE — MAU Provider Note (Signed)
Chief Complaint:  Abdominal Pain   None     HPI: Natasha Carson is a 32 y.o. G2P1001 at [redacted]w[redacted]d who presents to maternity admissions reporting abdominal cramping starting in low back and radiating to front lower abdomen x4-5 days.  She also reports vomiting 2-3x/day since the pain started.  She does not currently report nausea. On the day her pain started, she ran to catch her child and felt that she may have pulled something.  But, when the pain worsened today and Tylenol was not helping, she came in to MAU to be evaluated.  She has known uterine fibroids which caused pain in early pregnancy but her pain had resolved until this week.  She reports daily fetal movement, denies LOF, vaginal bleeding, vaginal itching/burning, urinary symptoms, h/a, dizziness, or fever/chills.     Past Medical History: Past Medical History  Diagnosis Date  . Asthma   . Fibroid     Past obstetric history: OB History  Gravida Para Term Preterm AB SAB TAB Ectopic Multiple Living  2 1 1       1     # Outcome Date GA Lbr Len/2nd Weight Sex Delivery Anes PTL Lv  2 CUR           1 TRM               Past Surgical History: Past Surgical History  Procedure Laterality Date  . No past surgeries      Family History: Family History  Problem Relation Age of Onset  . Diabetes Mother   . Hypertension Mother     Social History: History  Substance Use Topics  . Smoking status: Never Smoker   . Smokeless tobacco: Not on file  . Alcohol Use: No    Allergies:  Allergies  Allergen Reactions  . Other Anaphylaxis    Coconut    Meds:  Prescriptions prior to admission  Medication Sig Dispense Refill  . Prenatal Vit-Fe Fumarate-FA (PRENATAL MULTIVITAMIN) TABS tablet Take 1 tablet by mouth daily at 12 noon.      . [DISCONTINUED] acetaminophen (TYLENOL) 500 MG tablet Take 1,000 mg by mouth every 6 (six) hours as needed for moderate pain.       Marland Kitchen albuterol (PROVENTIL HFA;VENTOLIN HFA) 108 (90 BASE) MCG/ACT  inhaler Inhale 2 puffs into the lungs every 6 (six) hours as needed for wheezing.        ROS: Pertinent findings in history of present illness.  Physical Exam  Blood pressure 103/67, pulse 119, temperature 98.1 F (36.7 C), temperature source Oral, resp. rate 22, height 5\' 1"  (1.549 m), weight 94.348 kg (208 lb), last menstrual period 12/24/2012. GENERAL: Well-developed, well-nourished female in no acute distress.  HEENT: normocephalic HEART: normal rate RESP: normal effort ABDOMEN: Soft, non-tender, gravid appropriate for gestational age EXTREMITIES: Nontender, no edema NEURO: alert and oriented Cervix closed Effacement (%): Thick Station:  (high) Exam by:: L Leftwich-Kirby CNM  FHT:  155 by U/S (EFM applied by RN instead of doppler)   Labs: Results for orders placed during the hospital encounter of 05/13/13 (from the past 24 hour(s))  URINALYSIS, ROUTINE W REFLEX MICROSCOPIC     Status: Abnormal   Collection Time    05/13/13 12:05 PM      Result Value Range   Color, Urine AMBER (*) YELLOW   APPearance HAZY (*) CLEAR   Specific Gravity, Urine >1.030 (*) 1.005 - 1.030   pH 6.0  5.0 - 8.0   Glucose, UA NEGATIVE  NEGATIVE mg/dL   Hgb urine dipstick TRACE (*) NEGATIVE   Bilirubin Urine SMALL (*) NEGATIVE   Ketones, ur >80 (*) NEGATIVE mg/dL   Protein, ur 30 (*) NEGATIVE mg/dL   Urobilinogen, UA 1.0  0.0 - 1.0 mg/dL   Nitrite NEGATIVE  NEGATIVE   Leukocytes, UA NEGATIVE  NEGATIVE  URINE MICROSCOPIC-ADD ON     Status: Abnormal   Collection Time    05/13/13 12:05 PM      Result Value Range   Squamous Epithelial / LPF MANY (*) RARE   WBC, UA 3-6  <3 WBC/hpf   RBC / HPF 3-6  <3 RBC/hpf   Bacteria, UA MANY (*) RARE   Urine-Other MUCOUS PRESENT       Assessment: 1. Uterine fibroid in pregnancy, second trimester   2. Musculoskeletal pain     Plan: Consult Dr Richardson Dopp D5LR x1000 ml, LR x1000 ml, Flexeril 10 mg x1, and Vicodin 5/325 x1 in MAU Pt declines nausea medication  in MAU, declines second Vicodin tablet D/C home Increase PO fluids--use anti-nausea meds at home as needed to tolerate PO Flexeril 5-10 mg TID PRN Vicodin 5/325, 1-2 tabs Q6 hours PRN F/U with Dr Richardson Dopp Return to MAU as needed       Follow-up Information   Follow up with Jessee Avers., MD. (Keep scheduled appointment.  Return to MAU as needed.)    Specialty:  Obstetrics and Gynecology   Contact information:   [address] [place] [address] [phone]        Medication List    STOP taking these medications       acetaminophen 500 MG tablet  Commonly known as:  TYLENOL      TAKE these medications       albuterol 108 (90 BASE) MCG/ACT inhaler  Commonly known as:  PROVENTIL HFA;VENTOLIN HFA  Inhale 2 puffs into the lungs every 6 (six) hours as needed for wheezing.     cyclobenzaprine 10 MG tablet  Commonly known as:  FLEXERIL  Take 0.5-1 tablets (5-10 mg total) by mouth 3 (three) times daily as needed for muscle spasms.     HYDROcodone-acetaminophen 5-325 MG per tablet  Commonly known as:  NORCO/VICODIN  Take 1-2 tablets by mouth every 6 (six) hours as needed for moderate pain.     prenatal multivitamin Tabs tablet  Take 1 tablet by mouth daily at 12 noon.        Sharen Counter Certified Nurse-Midwife 05/13/2013 5:12 PM

## 2013-05-13 NOTE — MAU Note (Signed)
C/o intermittent  lower back and abdominal pain since Thanksgiving; hx of uterine fibroids; 20 weeks gest;

## 2013-05-14 LAB — URINE CULTURE: Colony Count: 70000

## 2013-06-13 NOTE — L&D Delivery Note (Signed)
Delivery Note At 10:15 PM a viable female "Rosaura Carpenter" was delivered via Vaginal, Spontaneous Delivery (Presentation: OA).  APGAR: 8, 9; weight pending.   Placenta status: Intact, Spontaneous, sent to pathology.  Cord:  with the following complications: none.  Cord pH: not collected.  Anesthesia: Epidural  Episiotomy: None Lacerations: 1st degree perineal Suture Repair: 3.0 vicryl Est. Blood Loss (mL): 300  Mom to postpartum.  Baby to Couplet care / Skin to Skin.  Routine PP orders Breastfeeding  Linda Hedges 09/18/2013, 10:44 PM

## 2013-07-02 ENCOUNTER — Inpatient Hospital Stay (HOSPITAL_COMMUNITY)
Admission: AD | Admit: 2013-07-02 | Payer: BC Managed Care – PPO | Source: Ambulatory Visit | Admitting: Obstetrics and Gynecology

## 2013-09-17 ENCOUNTER — Encounter (HOSPITAL_COMMUNITY): Payer: Self-pay | Admitting: *Deleted

## 2013-09-17 ENCOUNTER — Inpatient Hospital Stay (HOSPITAL_COMMUNITY): Payer: BC Managed Care – PPO

## 2013-09-17 ENCOUNTER — Inpatient Hospital Stay (HOSPITAL_COMMUNITY)
Admission: AD | Admit: 2013-09-17 | Discharge: 2013-09-17 | Disposition: A | Discharge status: Home / Self Care | Payer: BC Managed Care – PPO | Source: Ambulatory Visit | Attending: Obstetrics and Gynecology | Admitting: Obstetrics and Gynecology

## 2013-09-17 DIAGNOSIS — O368190 Decreased fetal movements, unspecified trimester, not applicable or unspecified: Secondary | ICD-10-CM | POA: Insufficient documentation

## 2013-09-17 DIAGNOSIS — J45909 Unspecified asthma, uncomplicated: Secondary | ICD-10-CM | POA: Insufficient documentation

## 2013-09-17 DIAGNOSIS — O409XX0 Polyhydramnios, unspecified trimester, not applicable or unspecified: Secondary | ICD-10-CM

## 2013-09-17 DIAGNOSIS — Z833 Family history of diabetes mellitus: Secondary | ICD-10-CM | POA: Insufficient documentation

## 2013-09-17 DIAGNOSIS — R51 Headache: Secondary | ICD-10-CM | POA: Insufficient documentation

## 2013-09-17 DIAGNOSIS — O479 False labor, unspecified: Secondary | ICD-10-CM | POA: Insufficient documentation

## 2013-09-17 LAB — URINALYSIS, ROUTINE W REFLEX MICROSCOPIC
Bilirubin Urine: NEGATIVE
Glucose, UA: NEGATIVE mg/dL
Hgb urine dipstick: NEGATIVE
Ketones, ur: 15 mg/dL — AB
Leukocytes, UA: NEGATIVE
Nitrite: NEGATIVE
Protein, ur: NEGATIVE mg/dL
Specific Gravity, Urine: 1.01 (ref 1.005–1.030)
Urobilinogen, UA: 0.2 mg/dL (ref 0.0–1.0)
pH: 7 (ref 5.0–8.0)

## 2013-09-17 LAB — COMPREHENSIVE METABOLIC PANEL
ALT: <5 U/L (ref 0–35)
AST: 12 U/L (ref 0–37)
Albumin: 2.9 g/dL — ABNORMAL LOW (ref 3.5–5.2)
Alkaline Phosphatase: 108 U/L (ref 39–117)
BUN: 4 mg/dL — ABNORMAL LOW (ref 6–23)
CO2: 19 mEq/L (ref 19–32)
Calcium: 9.4 mg/dL (ref 8.4–10.5)
Chloride: 102 mEq/L (ref 96–112)
Creatinine, Ser: 0.36 mg/dL — ABNORMAL LOW (ref 0.50–1.10)
GFR calc Af Amer: >90 mL/min (ref >90)
GFR calc non Af Amer: >90 mL/min (ref >90)
Glucose, Bld: 88 mg/dL (ref 70–99)
Potassium: 3.5 mEq/L — ABNORMAL LOW (ref 3.7–5.3)
Sodium: 137 mEq/L (ref 137–147)
Total Bilirubin: 0.3 mg/dL (ref 0.3–1.2)
Total Protein: 6.5 g/dL (ref 6.0–8.3)

## 2013-09-17 LAB — CBC
HCT: 30.5 % — ABNORMAL LOW (ref 36.0–46.0)
Hemoglobin: 9.9 g/dL — ABNORMAL LOW (ref 12.0–15.0)
MCH: 27.1 pg (ref 26.0–34.0)
MCHC: 32.5 g/dL (ref 30.0–36.0)
MCV: 83.6 fL (ref 78.0–100.0)
Platelets: 278 10*3/uL (ref 150–400)
RBC: 3.65 MIL/uL — ABNORMAL LOW (ref 3.87–5.11)
RDW: 13.8 % (ref 11.5–15.5)
WBC: 9.6 10*3/uL (ref 4.0–10.5)

## 2013-09-17 MED ORDER — LACTATED RINGERS IV BOLUS (SEPSIS)
1000.0000 mL | Freq: Once | INTRAVENOUS | Status: AC
  Administered 2013-09-17: 1000 mL via INTRAVENOUS

## 2013-09-17 MED ORDER — ACETAMINOPHEN 500 MG PO TABS
1000.0000 mg | ORAL_TABLET | Freq: Once | ORAL | Status: AC
  Administered 2013-09-17: 1000 mg via ORAL
  Filled 2013-09-17: qty 2

## 2013-09-17 NOTE — Discharge Instructions (Signed)
Braxton Hicks Contractions Pregnancy is commonly associated with contractions of the uterus throughout the pregnancy. Towards the end of pregnancy (32 to 34 weeks), these contractions [facility] Ishmael Holter) can develop more often and may become more forceful. This is not true labor because these contractions do not result in opening (dilatation) and thinning of the cervix. They are sometimes difficult to tell apart from true labor because these contractions can be forceful and people have different pain tolerances. You should not feel embarrassed if you go to the hospital with false labor. Sometimes, the only way to tell if you are in true labor is for your caregiver to follow the changes in the cervix. How to tell the difference between true and false labor:  False labor.  The contractions of false labor are usually shorter, irregular and not as hard as those of true labor.  They are often felt in the front of the lower abdomen and in the groin.  They may leave with walking around or changing positions while lying down.  They get weaker and are shorter lasting as time goes on.  These contractions are usually irregular.  They do not usually become progressively stronger, regular and closer together as with true labor.  True labor.  Contractions in true labor last 30 to 70 seconds, become very regular, usually become more intense, and increase in frequency.  They do not go away with walking.  The discomfort is usually felt in the top of the uterus and spreads to the lower abdomen and low back.  True labor can be determined by your caregiver with an exam. This will show that the cervix is dilating and getting thinner. If there are no prenatal problems or other health problems associated with the pregnancy, it is completely safe to be sent home with false labor and await the onset of true labor. HOME CARE INSTRUCTIONS   Keep up with your usual exercises and instructions.  Take medications as  directed.  Keep your regular prenatal appointment.  Eat and drink lightly if you think you are going into labor.  If BH contractions are making you uncomfortable:  Change your activity position from lying down or resting to walking/walking to resting.  Sit and rest in a tub of warm water.  Drink 2 to 3 glasses of water. Dehydration may cause B-H contractions.  Do slow and deep breathing several times an hour. SEEK IMMEDIATE MEDICAL CARE IF:   Your contractions continue to become stronger, more regular, and closer together.  You have a gushing, burst or leaking of fluid from the vagina.  An oral temperature above 102 F (38.9 C) develops.  You have passage of blood-tinged mucus.  You develop vaginal bleeding.  You develop continuous belly (abdominal) pain.  You have low back pain that you never had before.  You feel the baby's head pushing down causing pelvic pressure.  The baby is not moving as much as it used to. Document Released: 05/30/2005 Document Revised: 08/22/2011 Document Reviewed: 03/11/2013 [facility] Patient Information 2014 [place], [place].  Fetal Biophysical Profile This is a test that measures five different variables of the fetus: Heart rate, breathing movement, total movement of the baby, fetal muscle tone, the amount of amniotic fluid, and the heart rate activity of the fetus. The five variables are measured individually and contribute either a 2 or a 0 to the overall scoring of the test. The measurements are as follows:  Fetal heart rate activity. This is measured and scored in the same  way as a non-stress test. The fetal heart rate is considered reactive when there are movement-associated fetal heart rate increases of at least 15 beats per minute above baseline, and 15 seconds in duration over a 20-minute period. A score of 2 is given for reactivity, and a score of 0 indicates that the fetal heart rate is non-reactive.  Fetal breathing movements. This is  scored based on fetal breathing movements and indicate fetal well-being. Their absence may indicate a low oxygen level for the fetus. Fetal breathing increases in frequency and uniformity after the 36th week of pregnancy. To earn a score of 2, the fetus must have at least one episode of fetal breathing lasting at least 60 seconds within a 30-minute observation. Absence of this breathing is scored a 0 on the BPP.  Fetal body movements. Fetal activity is a reflection of brain integrity and function. The presence of at least three episodes of fetal movements within a 30-minute period is given a score of 2. A score of 0 is given with two or less movements in this time period. Fetal activity is highest 1 to 3 hours after the mother has eaten a meal.  Fetal tone. In the uterus, the fetus is normally in a position of flexion. This means the head is bent down towards the knees. The fetus also stretches, rolls, and moves in the uterus. The arms, legs, trunk, and head may be flexed and extended. A score of 2 is earned when there is at least one episode of active extension with return flexion. A score of 0 is given for slow extension with a return to only partial flexion. Fetal movement not followed by return to flexion, limbs or spine in extension, and an open fetal hand score 0.  Amniotic fluid volume. Amniotic fluid volume has been demonstrated to be a good method of predicting fetal distress. Too little amniotic fluid has been associated with fetal abnormalities, slow uterine growth, and over due pregnancy. A score of 2 is given for this when there is at least one pocket of amniotic fluid that measures 1 cm in a specific area. A score of 0 indicates either that fluid is absent in most areas of the uterine cavity or that the largest pocket of fluid measures less than 1 cm. PREPARATION FOR TEST No preparation or fasting is necessary. NORMAL FINDINGS  A score of 8-10 points (if amniotic fluid volume is  adequate).  Possible critical values: Less than 4 may necessitate immediate delivery of fetus. Ranges for normal findings may vary among different laboratories and hospitals. You should always check with your doctor after having lab work or other tests done to discuss the meaning of your test results and whether your values are considered within normal limits. MEANING OF TEST  Your caregiver will go over the test results with you and discuss the importance and meaning of your results, as well as treatment options and the need for additional tests if necessary. OBTAINING THE TEST RESULTS  It is your responsibility to obtain your test results. Ask the lab or department performing the test when and how you will get your results. Document Released: 09/30/2004 Document Revised: 08/22/2011 Document Reviewed: 05/09/2008 [facility] Patient Information 2014 [place], [place].

## 2013-09-17 NOTE — MAU Note (Signed)
C/O HA for last 2-3 days, had BP checked at work, was 138/80.   Pt states this is high for her. Having back pain that wraps around to the front, no FM x 2 hours.  Denies bleeding or LOF.

## 2013-09-17 NOTE — MAU Provider Note (Signed)
History     CSN: [id number]  Arrival date and time: 09/17/13 1524   First Provider Initiated Contact with Patient 09/17/13 1655      Chief Complaint  Patient presents with  . Headache  . Decreased Fetal Movement  . Back Pain   HPI  Ms. Natasha Carson is a 33 y.o. female G2P1001 at 51w1dwho presents with decreased fetal movement, HA and back pain. She currently rates her HA pain 5/10; she took tylenol yesterday, however has not taken anything today. She has been eating well and feels she is drinking enough water. Since her arrival to MAU she has felt the baby move one time. She denies leaking of fluid or vaginal bleeding at this time.   OB History   Grav Para Term Preterm Abortions TAB SAB Ect Mult Living   2 1 1       1       Past Medical History  Diagnosis Date  . Asthma   . Fibroid     Past Surgical History  Procedure Laterality Date  . No past surgeries      Family History  Problem Relation Age of Onset  . Diabetes Mother   . Hypertension Mother     History  Substance Use Topics  . Smoking status: Never Smoker   . Smokeless tobacco: Not on file  . Alcohol Use: No    Allergies:  Allergies  Allergen Reactions  . Other Anaphylaxis    Coconut    Prescriptions prior to admission  Medication Sig Dispense Refill  . acetaminophen (TYLENOL) 325 MG tablet Take 650 mg by mouth every 6 (six) hours as needed for moderate pain.      .Marland Kitchenamoxicillin (AMOXIL) 500 MG capsule Take 500 mg by mouth 3 (three) times daily. Prescribed for dental procedure.      . Prenatal Vit-Fe Fumarate-FA (PRENATAL MULTIVITAMIN) TABS tablet Take 1 tablet by mouth daily at 12 noon.      .Marland Kitchenalbuterol (PROVENTIL HFA;VENTOLIN HFA) 108 (90 BASE) MCG/ACT inhaler Inhale 2 puffs into the lungs every 6 (six) hours as needed for wheezing.      . cyclobenzaprine (FLEXERIL) 10 MG tablet Take 0.5-1 tablets (5-10 mg total) by mouth 3 (three) times daily as needed for muscle spasms.  30 tablet  0  .  HYDROcodone-acetaminophen (NORCO/VICODIN) 5-325 MG per tablet Take 1-2 tablets by mouth every 6 (six) hours as needed for moderate pain.  10 tablet  0   Results for orders placed during the hospital encounter of 09/17/13 (from the past 48 hour(s))  URINALYSIS, ROUTINE W REFLEX MICROSCOPIC     Status: Abnormal   Collection Time    09/17/13  4:00 PM      Result Value Ref Range   Color, Urine YELLOW  YELLOW   APPearance CLEAR  CLEAR   Specific Gravity, Urine 1.010  1.005 - 1.030   pH 7.0  5.0 - 8.0   Glucose, UA NEGATIVE  NEGATIVE mg/dL   Hgb urine dipstick NEGATIVE  NEGATIVE   Bilirubin Urine NEGATIVE  NEGATIVE   Ketones, ur 15 (*) NEGATIVE mg/dL   Protein, ur NEGATIVE  NEGATIVE mg/dL   Urobilinogen, UA 0.2  0.0 - 1.0 mg/dL   Nitrite NEGATIVE  NEGATIVE   Leukocytes, UA NEGATIVE  NEGATIVE   Comment: MICROSCOPIC NOT DONE ON URINES WITH NEGATIVE PROTEIN, BLOOD, LEUKOCYTES, NITRITE, OR GLUCOSE <1000 mg/dL.  CBC     Status: Abnormal   Collection Time    09/17/13  5:30 PM      Result Value Ref Range   WBC 9.6  4.0 - 10.5 K/uL   RBC 3.65 (*) 3.87 - 5.11 MIL/uL   Hemoglobin 9.9 (*) 12.0 - 15.0 g/dL   HCT 30.5 (*) 36.0 - 46.0 %   MCV 83.6  78.0 - 100.0 fL   MCH 27.1  26.0 - 34.0 pg   MCHC 32.5  30.0 - 36.0 g/dL   RDW 13.8  11.5 - 15.5 %   Platelets 278  150 - 400 K/uL  COMPREHENSIVE METABOLIC PANEL     Status: Abnormal   Collection Time    09/17/13  5:30 PM      Result Value Ref Range   Sodium 137  137 - 147 mEq/L   Potassium 3.5 (*) 3.7 - 5.3 mEq/L   Chloride 102  96 - 112 mEq/L   CO2 19  19 - 32 mEq/L   Glucose, Bld 88  70 - 99 mg/dL   BUN 4 (*) 6 - 23 mg/dL   Creatinine, Ser 0.36 (*) 0.50 - 1.10 mg/dL   Calcium 9.4  8.4 - 10.5 mg/dL   Total Protein 6.5  6.0 - 8.3 g/dL   Albumin 2.9 (*) 3.5 - 5.2 g/dL   AST 12  0 - 37 U/L   ALT <5  0 - 35 U/L   Comment: REPEATED TO VERIFY   Alkaline Phosphatase 108  39 - 117 U/L   Total Bilirubin 0.3  0.3 - 1.2 mg/dL   GFR calc non Af Amer  >90  >90 mL/min   GFR calc Af Amer >90  >90 mL/min   Comment: (NOTE)     The eGFR has been calculated using the CKD EPI equation.     This calculation has not been validated in all clinical situations.     eGFR's persistently <90 mL/min signify possible Chronic Kidney     Disease.     Review of Systems  Constitutional: Negative for fever and chills.  Gastrointestinal: Positive for abdominal pain (+ contraction pain ). Negative for nausea, vomiting, diarrhea and constipation.  Genitourinary: Negative for dysuria, urgency, frequency and hematuria.       No vaginal discharge. No vaginal bleeding. No dysuria.   Neurological: Positive for headaches.   Physical Exam   Blood pressure 118/77, pulse 83, temperature 98.4 F (36.9 C), temperature source Oral, resp. rate 18, height 5' 2"  (1.575 m), weight 98.34 kg (216 lb 12.8 oz), last menstrual period 12/24/2012, SpO2 100.00%.  Physical Exam  Constitutional: She is oriented to person, place, and time. She appears well-developed and well-nourished. No distress.  HENT:  Head: Normocephalic.  Eyes: Pupils are equal, round, and reactive to light.  Neck: Normal range of motion.  Respiratory: Effort normal.  GI: Soft. She exhibits no distension. There is no tenderness.  Musculoskeletal: Normal range of motion.  Neurological: She is alert and oriented to person, place, and time.  Skin: Skin is warm. She is not diaphoretic.  Psychiatric: Her behavior is normal.    Fetal Tracing: Baseline: 145 bpm Variability: Moderate  Accelerations: 15x15 Decelerations: None Toco: 2-4 mins apart; irregular pattern   Dilation: 1 Effacement (%): 40 Cervical Position: Posterior Station: -2 Presentation: Vertex Exam by:: K.Wilson.RN  1925: cervix unchanged from previous exam   MAU Course  Procedures  MDM 4818: Discussed patient with Dr. Landry Mellow.  BPP CBC CMET LR bolus Tylenol 1 gram Pt back from [place] and rates her HA pain 0/10.  Pt feels  an  increase in fetal movement since IVF and [place].  1930: discussed lab findings and US findings with Dr. Landry Mellow  Dr. Landry Mellow returned call; pt is to follow up in the office tomorrow at 10:00 for repeat US and fetal weight.   Assessment and Plan   A: Decreased fetal movement BPP 6/8 on report; 8/8 with reactive fetal tracing Polyhydramnios Braxton hicks contractions  P:  Discharge home in stable condition Pt has a follow up appt tomorrow at 10:00 am for fetal weight and BPP  Kick counts Labor precautions Return to MAU if symptoms worsen    Natasha Hillock Blayke Cordrey, NP  09/17/2013, 8:05 PM

## 2013-09-18 ENCOUNTER — Inpatient Hospital Stay (HOSPITAL_COMMUNITY)
Admission: AD | Admit: 2013-09-18 | Discharge: 2013-09-20 | DRG: 775 | Disposition: A | Discharge status: Home / Self Care | Payer: BC Managed Care – PPO | Source: Ambulatory Visit | Attending: Obstetrics and Gynecology | Admitting: Obstetrics and Gynecology

## 2013-09-18 ENCOUNTER — Inpatient Hospital Stay (HOSPITAL_COMMUNITY): Payer: BC Managed Care – PPO | Admitting: Anesthesiology

## 2013-09-18 ENCOUNTER — Encounter (HOSPITAL_COMMUNITY): Payer: BC Managed Care – PPO | Admitting: Anesthesiology

## 2013-09-18 ENCOUNTER — Encounter (HOSPITAL_COMMUNITY): Payer: Self-pay | Admitting: *Deleted

## 2013-09-18 DIAGNOSIS — O2690 Pregnancy related conditions, unspecified, unspecified trimester: Secondary | ICD-10-CM | POA: Diagnosis present

## 2013-09-18 DIAGNOSIS — O34599 Maternal care for other abnormalities of gravid uterus, unspecified trimester: Secondary | ICD-10-CM | POA: Diagnosis present

## 2013-09-18 DIAGNOSIS — O3410 Maternal care for benign tumor of corpus uteri, unspecified trimester: Secondary | ICD-10-CM

## 2013-09-18 DIAGNOSIS — D259 Leiomyoma of uterus, unspecified: Secondary | ICD-10-CM | POA: Diagnosis present

## 2013-09-18 DIAGNOSIS — D4959 Neoplasm of unspecified behavior of other genitourinary organ: Secondary | ICD-10-CM | POA: Diagnosis present

## 2013-09-18 DIAGNOSIS — O368190 Decreased fetal movements, unspecified trimester, not applicable or unspecified: Secondary | ICD-10-CM | POA: Diagnosis present

## 2013-09-18 DIAGNOSIS — O409XX0 Polyhydramnios, unspecified trimester, not applicable or unspecified: Secondary | ICD-10-CM | POA: Diagnosis present

## 2013-09-18 DIAGNOSIS — D219 Benign neoplasm of connective and other soft tissue, unspecified: Secondary | ICD-10-CM

## 2013-09-18 DIAGNOSIS — O99214 Obesity complicating childbirth: Secondary | ICD-10-CM

## 2013-09-18 DIAGNOSIS — E669 Obesity, unspecified: Secondary | ICD-10-CM | POA: Diagnosis present

## 2013-09-18 DIAGNOSIS — Z6839 Body mass index (BMI) 39.0-39.9, adult: Secondary | ICD-10-CM

## 2013-09-18 LAB — CBC
HCT: 30.3 % — ABNORMAL LOW (ref 36.0–46.0)
Hemoglobin: 9.8 g/dL — ABNORMAL LOW (ref 12.0–15.0)
MCH: 27.1 pg (ref 26.0–34.0)
MCHC: 32.3 g/dL (ref 30.0–36.0)
MCV: 83.7 fL (ref 78.0–100.0)
Platelets: 285 10*3/uL (ref 150–400)
RBC: 3.62 MIL/uL — ABNORMAL LOW (ref 3.87–5.11)
RDW: 13.8 % (ref 11.5–15.5)
WBC: 9 10*3/uL (ref 4.0–10.5)

## 2013-09-18 LAB — RPR

## 2013-09-18 LAB — TYPE AND SCREEN
ABO/RH(D): O POS
Antibody Screen: NEGATIVE

## 2013-09-18 LAB — OB RESULTS CONSOLE RPR: RPR: NONREACTIVE

## 2013-09-18 LAB — OB RESULTS CONSOLE HIV ANTIBODY (ROUTINE TESTING): HIV: NONREACTIVE

## 2013-09-18 LAB — OB RESULTS CONSOLE HEPATITIS B SURFACE ANTIGEN: Hepatitis B Surface Ag: NEGATIVE

## 2013-09-18 LAB — OB RESULTS CONSOLE ANTIBODY SCREEN: Antibody Screen: NEGATIVE

## 2013-09-18 LAB — OB RESULTS CONSOLE GBS: GBS: NEGATIVE

## 2013-09-18 LAB — OB RESULTS CONSOLE RUBELLA ANTIBODY, IGM: Rubella: IMMUNE

## 2013-09-18 MED ORDER — EPHEDRINE 5 MG/ML INJ
10.0000 mg | INTRAVENOUS | Status: DC | PRN
  Filled 2013-09-18: qty 2
  Filled 2013-09-18: qty 4

## 2013-09-18 MED ORDER — ONDANSETRON HCL 4 MG/2ML IJ SOLN: 4.0000 mg | Freq: Four times a day (QID) | INTRAMUSCULAR | Status: DC | PRN

## 2013-09-18 MED ORDER — FENTANYL 2.5 MCG/ML BUPIVACAINE 1/10 % EPIDURAL INFUSION (WH - ANES)
INTRAMUSCULAR | Status: DC | PRN
  Administered 2013-09-18: 14 mL/h via EPIDURAL

## 2013-09-18 MED ORDER — LIDOCAINE HCL (PF) 1 % IJ SOLN
INTRAMUSCULAR | Status: DC | PRN
  Administered 2013-09-18 (×2): 8 mL

## 2013-09-18 MED ORDER — LACTATED RINGERS IV SOLN: 500.0000 mL | INTRAVENOUS | Status: DC | PRN

## 2013-09-18 MED ORDER — OXYTOCIN 40 UNITS IN LACTATED RINGERS INFUSION - SIMPLE MED: 62.5000 mL/h | INTRAVENOUS | Status: DC

## 2013-09-18 MED ORDER — DIPHENHYDRAMINE HCL 50 MG/ML IJ SOLN: 12.5000 mg | INTRAMUSCULAR | Status: DC | PRN

## 2013-09-18 MED ORDER — PHENYLEPHRINE 40 MCG/ML (10ML) SYRINGE FOR IV PUSH (FOR BLOOD PRESSURE SUPPORT)
80.0000 ug | PREFILLED_SYRINGE | INTRAVENOUS | Status: DC | PRN
  Filled 2013-09-18: qty 2

## 2013-09-18 MED ORDER — OXYCODONE-ACETAMINOPHEN 5-325 MG PO TABS: 1.0000 | ORAL_TABLET | ORAL | Status: DC | PRN

## 2013-09-18 MED ORDER — ACETAMINOPHEN 325 MG PO TABS: 650.0000 mg | ORAL_TABLET | ORAL | Status: DC | PRN

## 2013-09-18 MED ORDER — FENTANYL 2.5 MCG/ML BUPIVACAINE 1/10 % EPIDURAL INFUSION (WH - ANES)
14.0000 mL/h | INTRAMUSCULAR | Status: DC | PRN
  Filled 2013-09-18: qty 125

## 2013-09-18 MED ORDER — TERBUTALINE SULFATE 1 MG/ML IJ SOLN: 0.2500 mg | Freq: Once | INTRAMUSCULAR | Status: AC | PRN

## 2013-09-18 MED ORDER — CITRIC ACID-SODIUM CITRATE 334-500 MG/5ML PO SOLN: 30.0000 mL | ORAL | Status: DC | PRN

## 2013-09-18 MED ORDER — IBUPROFEN 600 MG PO TABS
600.0000 mg | ORAL_TABLET | Freq: Four times a day (QID) | ORAL | Status: DC | PRN
  Filled 2013-09-18: qty 1

## 2013-09-18 MED ORDER — PHENYLEPHRINE 40 MCG/ML (10ML) SYRINGE FOR IV PUSH (FOR BLOOD PRESSURE SUPPORT)
80.0000 ug | PREFILLED_SYRINGE | INTRAVENOUS | Status: DC | PRN
  Filled 2013-09-18: qty 2
  Filled 2013-09-18: qty 10

## 2013-09-18 MED ORDER — LIDOCAINE HCL (PF) 1 % IJ SOLN
30.0000 mL | INTRAMUSCULAR | Status: DC | PRN
  Administered 2013-09-18: 30 mL via SUBCUTANEOUS
  Filled 2013-09-18: qty 30

## 2013-09-18 MED ORDER — BUTORPHANOL TARTRATE 1 MG/ML IJ SOLN: 1.0000 mg | INTRAMUSCULAR | Status: DC | PRN

## 2013-09-18 MED ORDER — OXYTOCIN 40 UNITS IN LACTATED RINGERS INFUSION - SIMPLE MED
1.0000 m[IU]/min | INTRAVENOUS | Status: DC
  Administered 2013-09-18: 2 m[IU]/min via INTRAVENOUS
  Filled 2013-09-18: qty 1000

## 2013-09-18 MED ORDER — LACTATED RINGERS IV SOLN
INTRAVENOUS | Status: DC
  Administered 2013-09-18: 13:00:00 via INTRAVENOUS

## 2013-09-18 MED ORDER — OXYTOCIN BOLUS FROM INFUSION
500.0000 mL | INTRAVENOUS | Status: DC
Start: 2013-09-18 — End: 2013-09-19
  Administered 2013-09-18: 500 mL via INTRAVENOUS

## 2013-09-18 MED ORDER — EPHEDRINE 5 MG/ML INJ
10.0000 mg | INTRAVENOUS | Status: DC | PRN
  Administered 2013-09-18: 10 mg via INTRAVENOUS
  Filled 2013-09-18: qty 2

## 2013-09-18 MED ORDER — MISOPROSTOL 200 MCG PO TABS
ORAL_TABLET | ORAL | Status: AC
  Filled 2013-09-18: qty 5

## 2013-09-18 MED ORDER — LACTATED RINGERS IV SOLN
500.0000 mL | Freq: Once | INTRAVENOUS | Status: AC
  Administered 2013-09-18: 500 mL via INTRAVENOUS

## 2013-09-18 NOTE — Anesthesia Procedure Notes (Signed)
Epidural Patient location during procedure: OB Start time: 09/18/2013 9:00 PM End time: 09/18/2013 9:05 PM  Staffing Anesthesiologist: Lyn Hollingshead  Preanesthetic Checklist Completed: patient identified, surgical consent, pre-op evaluation, timeout performed, IV checked, risks and benefits discussed and monitors and equipment checked  Epidural Patient position: sitting Prep: site prepped and draped and DuraPrep Patient monitoring: continuous pulse ox and blood pressure Approach: midline Location: L3-L4 Injection technique: LOR air  Needle:  Needle type: Tuohy  Needle gauge: 17 G Needle length: 9 cm and 9 Needle insertion depth: 6 cm Catheter type: closed end flexible Catheter size: 19 Gauge Catheter at skin depth: 11 cm Test dose: negative and Other  Assessment Sensory level: T9 Events: blood not aspirated, injection not painful, no injection resistance, negative IV test and no paresthesia  Additional Notes Reason for block:procedure for pain

## 2013-09-18 NOTE — H&P (Signed)
Natasha Carson is a 33 y.o. female G2P1001 at 38 wks 2 days by LMP confirmed by  8 wk Ultrasound with EDD 10/09/2013. Pt was seen in the office today for antenatal testing due to decreased fetal movement and polyhydramnios. BPP in the office was 6 out of 10 . The NST was not reactive with minimal varibility 10x10 accelerations. 1 spontaneous decel to the 120's.  Pt has decreased fetal movement since yesterday morning. Her pregnancy has been complicated by Uterine fibroids. On ultrasound today her uterine fibroids are on the left side 8.2 cm x 7.7 cm x 6.3 cm . SHe also has one in the left lower uterine segment that is 8.3 x 7.6 x 6.4 cm .    Maternal Medical History:  Reason for admission: Contractions.   Contractions: Onset was yesterday.   Frequency: irregular.   Duration is approximately 60 seconds.   Perceived severity is mild.    Fetal activity: Perceived fetal activity is decreased.   Last perceived fetal movement was within the past hour.    Prenatal complications: Polyhydramnios.     OB History   Grav Para Term Preterm Abortions TAB SAB Ect Mult Living   2 1 1       1       02/2002 SVD 8 lbs 4 oz       Past Medical History  Diagnosis Date  . Fibroid   . Asthma     albuterol prn--last month   Past Surgical History  Procedure Laterality Date  . No past surgeries     Family History: family history includes Diabetes in her mother; Hypertension in her mother. Social History:  reports that she has never smoked. She does not have any smokeless tobacco history on file. She reports that she does not drink alcohol or use illicit drugs.   Prenatal Transfer Tool  Maternal Diabetes: No Genetic Screening: Normal Maternal Ultrasounds/Referrals: Normal Fetal Ultrasounds or other Referrals:  None Maternal Substance Abuse:  No Significant Maternal Medications:  None Significant Maternal Lab Results:  Lab values include: Group B Strep negative Other Comments:  None  Review  of Systems  All other systems reviewed and are negative.   Dilation: 2 Effacement (%): 50 Station: -2 Exam by:: L.Lima, RN Blood pressure 124/81, pulse 94, resp. rate 18, height 5\' 2"  (1.575 m), weight 97.977 kg (216 lb), last menstrual period 12/24/2012. Maternal Exam:  Uterine Assessment: Contraction strength is mild.  Contraction frequency is irregular.   Abdomen: Estimated fetal weight is 6 lbs  12 oz on ultrasound today .   Fetal presentation: vertex  Introitus: Normal vulva. Normal vagina.  Pelvis: adequate for delivery.   Cervix: Cervix evaluated by digital exam.     Fetal Exam Fetal Monitor Review: Baseline rate: 140.  Variability: minimal (<5 bpm).   Pattern: accelerations present and no decelerations.    Fetal State Assessment: Category I - tracings are normal.     Physical Exam  Constitutional: She is oriented to person, place, and time. She appears well-developed and well-nourished.  Cardiovascular: Normal rate and regular rhythm.   Respiratory: Effort normal and breath sounds normal.  GI: There is no tenderness.  Genitourinary: Vagina normal and uterus normal.  2/50/-2  Musculoskeletal: Normal range of motion. She exhibits edema.  Neurological: She is alert and oriented to person, place, and time.  Skin: Skin is warm and dry.  Psychiatric: She has a normal mood and affect.    Prenatal labs: ABO, Rh: --/--/O POS (08/20 1211)  Antibody: Negative (04/08 0000) Rubella: Immune (04/08 0000) RPR: Nonreactive (04/08 0000)  HBsAg: Negative (04/08 0000)  HIV: Non-reactive (04/08 0000)  GBS: Negative (04/08 0000)   Assessment/Plan: 38 wks and 2 days with decreased fetal movement polyhydramnios and abnormal antenatal testing BPP 6 out of 10 today. Admitted for induction. Plan pitocin IV.  Uterine fibroids. One is in the lower uterine segment  Sill monitor closely for difficulty with descent.  Anticipate SVD.    Maeola Sarah Cherokee Boccio 09/18/2013, 1:48 PM

## 2013-09-18 NOTE — Anesthesia Preprocedure Evaluation (Signed)
Anesthesia Evaluation  Patient identified by MRN, date of birth, ID band Patient awake    Reviewed: Allergy & Precautions, H&P , NPO status , Patient's Chart, lab work & pertinent test results  Airway Mallampati: II TM Distance: >3 FB Neck ROM: full    Dental no notable dental hx.    Pulmonary    Pulmonary exam normal       Cardiovascular negative cardio ROS      Neuro/Psych negative neurological ROS  negative psych ROS   GI/Hepatic negative GI ROS, Neg liver ROS,   Endo/Other  Morbid obesity  Renal/GU negative Renal ROS     Musculoskeletal   Abdominal (+) + obese,   Peds  Hematology negative hematology ROS (+)   Anesthesia Other Findings   Reproductive/Obstetrics (+) Pregnancy                           Anesthesia Physical Anesthesia Plan  ASA: III  Anesthesia Plan: Epidural   Post-op Pain Management:    Induction:   Airway Management Planned:   Additional Equipment:   Intra-op Plan:   Post-operative Plan:   Informed Consent: I have reviewed the patients History and Physical, chart, labs and discussed the procedure including the risks, benefits and alternatives for the proposed anesthesia with the patient or authorized representative who has indicated his/her understanding and acceptance.     Plan Discussed with:   Anesthesia Plan Comments:         Anesthesia Quick Evaluation

## 2013-09-18 NOTE — Progress Notes (Signed)
  Subjective: Pt is having to work with each UC.  Pt requests epidural at this time.  Family at bedside and supportive.  Objective: BP 141/89  Pulse 109  Temp(Src) 98.5 F (36.9 C) (Oral)  Resp 16  Ht 5\' 8"  (1.727 m)  Wt 314 lb (142.429 kg)  BMI 47.75 kg/m2  SpO2 100%  LMP 12/10/2012      FHT: Category II UC:   regular, every 1-4 minutes  Dilation: 3 Effacement (%): 60 Cervical Position: Middle Station: -2 Presentation: Vertex Exam by:: Brayton Baumgartner CNM   Induction of labor due to non-reassuring fetal testing,  progressing well on pitocin  Labor: Progressing on Pitocin, Pitocin at 12; MVUs 225 - 280  Preeclampsia: n/a  Fetal Wellbeing: Category II  Pain Control: Epidural  I/D: GBS neg; AROM at 1734; Afebrile  Anticipated MOD: NSVD    Beatrice Ziehm 09/14/2013, 7:23 AM

## 2013-09-18 NOTE — Progress Notes (Signed)
Natasha Carson is a 33 y.o. G2P1001 at [redacted]w[redacted]d by LMP admitted for induction of labor due to Non-reactive NST and BPP 6 out 10 in the office .  Subjective:  pt reports contractions 5 out 10 in intensity .Marland Kitchen Feeling more movement now. No lof no vaginal bleeding   Objective: BP 116/65  Pulse 80  Temp(Src) 98.3 F (36.8 C) (Oral)  Resp 18  Ht 5\' 2"  (1.575 m)  Wt 97.977 kg (216 lb)  BMI 39.50 kg/m2  LMP 12/24/2012      FHT:  FHR: 140 bpm, variability: moderate,  accelerations:  Present,  decelerations:  Absent UC:   irregular, every 2-3 minutes SVE:   Dilation: 2 Effacement (%): 50 Station: -2 Exam by:: Dr Landry Mellow  Labs: Lab Results  Component Value Date   WBC 9.0 09/18/2013   HGB 9.8* 09/18/2013   HCT 30.3* 09/18/2013   MCV 83.7 09/18/2013   PLT 285 09/18/2013    Assessment / Plan: Induction of labor due to nonreassuring fetal testing.    Labor: Continue pitocin Preeclampsia:  NA Fetal Wellbeing:  Category I Pain Control:   labor without medications currently pt is unsure regarding epidural  I/D:  n/a Anticipated MOD:  NSVD  Natasha Carson J. Christophr Calix 09/18/2013, 5:39 PM

## 2013-09-19 DIAGNOSIS — D219 Benign neoplasm of connective and other soft tissue, unspecified: Secondary | ICD-10-CM

## 2013-09-19 LAB — CBC
HCT: 27.4 % — ABNORMAL LOW (ref 36.0–46.0)
Hemoglobin: 8.7 g/dL — ABNORMAL LOW (ref 12.0–15.0)
MCH: 26.6 pg (ref 26.0–34.0)
MCHC: 31.8 g/dL (ref 30.0–36.0)
MCV: 83.8 fL (ref 78.0–100.0)
Platelets: 246 10*3/uL (ref 150–400)
RBC: 3.27 MIL/uL — ABNORMAL LOW (ref 3.87–5.11)
RDW: 13.7 % (ref 11.5–15.5)
WBC: 11.3 10*3/uL — ABNORMAL HIGH (ref 4.0–10.5)

## 2013-09-19 MED ORDER — OXYTOCIN 10 UNIT/ML IJ SOLN
40.0000 [IU] | INTRAVENOUS | Status: DC
  Filled 2013-09-19: qty 4

## 2013-09-19 MED ORDER — ONDANSETRON HCL 4 MG PO TABS: 4.0000 mg | ORAL_TABLET | ORAL | Status: DC | PRN

## 2013-09-19 MED ORDER — BENZOCAINE-MENTHOL 20-0.5 % EX AERO: 1.0000 "application " | INHALATION_SPRAY | CUTANEOUS | Status: DC | PRN

## 2013-09-19 MED ORDER — SENNOSIDES-DOCUSATE SODIUM 8.6-50 MG PO TABS
2.0000 | ORAL_TABLET | ORAL | Status: DC
  Administered 2013-09-19: 2 via ORAL
  Filled 2013-09-19 (×2): qty 2

## 2013-09-19 MED ORDER — IBUPROFEN 600 MG PO TABS
600.0000 mg | ORAL_TABLET | Freq: Four times a day (QID) | ORAL | Status: DC
  Administered 2013-09-19 – 2013-09-20 (×6): 600 mg via ORAL
  Filled 2013-09-19 (×6): qty 1

## 2013-09-19 MED ORDER — LANOLIN HYDROUS EX OINT: TOPICAL_OINTMENT | CUTANEOUS | Status: DC | PRN

## 2013-09-19 MED ORDER — PNEUMOCOCCAL VAC POLYVALENT 25 MCG/0.5ML IJ INJ
0.5000 mL | INJECTION | Freq: Once | INTRAMUSCULAR | Status: AC
  Administered 2013-09-19: 0.5 mL via INTRAMUSCULAR
  Filled 2013-09-19: qty 0.5

## 2013-09-19 MED ORDER — OXYCODONE-ACETAMINOPHEN 5-325 MG PO TABS: 1.0000 | ORAL_TABLET | ORAL | Status: DC | PRN

## 2013-09-19 MED ORDER — DIBUCAINE 1 % RE OINT: 1.0000 "application " | TOPICAL_OINTMENT | RECTAL | Status: DC | PRN

## 2013-09-19 MED ORDER — WITCH HAZEL-GLYCERIN EX PADS: 1.0000 "application " | MEDICATED_PAD | CUTANEOUS | Status: DC | PRN

## 2013-09-19 MED ORDER — SIMETHICONE 80 MG PO CHEW: 80.0000 mg | CHEWABLE_TABLET | ORAL | Status: DC | PRN

## 2013-09-19 MED ORDER — PRENATAL MULTIVITAMIN CH
1.0000 | ORAL_TABLET | Freq: Every day | ORAL | Status: DC
  Administered 2013-09-19 – 2013-09-20 (×2): 1 via ORAL
  Filled 2013-09-19 (×2): qty 1

## 2013-09-19 MED ORDER — ZOLPIDEM TARTRATE 5 MG PO TABS: 5.0000 mg | ORAL_TABLET | Freq: Every evening | ORAL | Status: DC | PRN

## 2013-09-19 MED ORDER — DIPHENHYDRAMINE HCL 25 MG PO CAPS: 25.0000 mg | ORAL_CAPSULE | Freq: Four times a day (QID) | ORAL | Status: DC | PRN

## 2013-09-19 MED ORDER — TETANUS-DIPHTH-ACELL PERTUSSIS 5-2.5-18.5 LF-MCG/0.5 IM SUSP
0.5000 mL | Freq: Once | INTRAMUSCULAR | Status: AC
  Administered 2013-09-20: 0.5 mL via INTRAMUSCULAR
  Filled 2013-09-19: qty 0.5

## 2013-09-19 MED ORDER — ONDANSETRON HCL 4 MG/2ML IJ SOLN: 4.0000 mg | INTRAMUSCULAR | Status: DC | PRN

## 2013-09-19 NOTE — Progress Notes (Signed)
Post Partum Day 1 Subjective: no complaints, up ad lib, voiding and tolerating PO  Objective: Blood pressure 108/67, pulse 83, temperature 98.6 F (37 C), temperature source Oral, resp. rate 16, height 5\' 2"  (1.575 m), weight 97.977 kg (216 lb), last menstrual period 12/24/2012, SpO2 98.00%, unknown if currently breastfeeding.  Physical Exam:  General: alert and cooperative Lochia: appropriate Uterine Fundus: firm Incision: NA DVT Evaluation: Negative Homan's sign.   Recent Labs  09/18/13 1320 09/19/13 0610  HGB 9.8* 8.7*  HCT 30.3* 27.4*    Assessment/Plan: Plan for discharge tomorrow and Breastfeeding   LOS: 1 day   Natasha Carson 09/19/2013, 2:47 PM

## 2013-09-19 NOTE — Anesthesia Postprocedure Evaluation (Signed)
  Anesthesia Post-op Note  Patient: Natasha Carson  Procedure(s) Performed: * No procedures listed *  Patient Location: Mother/Baby  Anesthesia Type:Epidural  Level of Consciousness: awake, alert , oriented and patient cooperative  Airway and Oxygen Therapy: Patient Spontanous Breathing  Post-op Pain: mild  Post-op Assessment: Patient's Cardiovascular Status Stable, Respiratory Function Stable, Patent Airway, No signs of Nausea or vomiting, Adequate PO intake, Pain level controlled and No headache  Post-op Vital Signs: Reviewed and stable  Last Vitals:  Filed Vitals:   09/19/13 0601  BP: 108/67  Pulse: 83  Temp: 37 C  Resp: 16    Complications: No apparent anesthesia complications

## 2013-09-19 NOTE — Lactation Note (Signed)
This note was copied from the chart of Natasha Annette Dalton-Jones. Lactation Consultation Note Initial consultation, baby 83 hours old, mom states has been feeding very well, states baby just finished a feeding, mom is holding baby in bed. Mom breast fed her other child, now 32 years old, for 2 years without problems. Enc frequent STS and cue based feeding. Reviewed baby and me book br feeding basics, lactation brochure, community resources, BFSG. Enc mom to call if she has any concerns.   Patient Name: Natasha Carson [id number] Date: 09/19/2013 Reason for consult: Initial assessment   Maternal Data Formula Feeding for Exclusion: No Has patient been taught Hand Expression?: Yes Does the patient have breastfeeding experience prior to this delivery?: Yes  Feeding    LATCH Score/Interventions                      Lactation Tools Discussed/Used     Consult Status Consult Status: PRN    Thomes Cake 09/19/2013, 3:07 PM

## 2013-09-20 DIAGNOSIS — O409XX0 Polyhydramnios, unspecified trimester, not applicable or unspecified: Secondary | ICD-10-CM | POA: Diagnosis present

## 2013-09-20 MED ORDER — IBUPROFEN 600 MG PO TABS: 600.0000 mg | ORAL_TABLET | Freq: Four times a day (QID) | ORAL | Status: AC | PRN

## 2013-09-20 NOTE — Lactation Note (Signed)
This note was copied from the chart of Natasha Carson. Lactation Consultation Note  Patient Name: Natasha Sanjana Folz UUVOZ'D Date: 09/20/2013   Follow-up of baby 24 hours old prior to discharge. Mom experienced breast feeder reports breastfeeding going well with no issues. Discussed engorgement prevention/treatment. Gave mom a manual pump. Referred mom to Baby and Me booklet for number of diapers to look expect. Enc mom to call if she has any questions/concerns.   Maternal Data    Feeding    LATCH Score/Interventions                      Lactation Tools Discussed/Used     Consult Status      Andres Labrum 09/20/2013, 11:11 AM

## 2013-09-20 NOTE — Discharge Summary (Signed)
Obstetric Discharge Summary Reason2 for Admission: induction of labor Prenatal Procedures: none Intrapartum Procedures: spontaneous vaginal delivery Postpartum Procedures: none Complications-Operative and Postpartum: none Hemoglobin  Date Value Ref Range Status  09/19/2013 8.7* 12.0 - 15.0 g/dL Final     HCT  Date Value Ref Range Status  09/19/2013 27.4* 36.0 - 46.0 % Final    Physical Exam:  General: alert and cooperative Lochia: appropriate Uterine Fundus: firm Incision: NA DVT Evaluation: No evidence of DVT seen on physical exam.  Discharge Diagnoses: Term Pregnancy-delivered and Fibroids  Discharge Information: Date: 09/20/2013 Activity: pelvic rest Diet: routine Medications: PNV and Ibuprofen Condition: stable Instructions: refer to practice specific booklet Discharge to: home Follow-up Information   Follow up with Catha Brow., MD. Schedule an appointment as soon as possible for a visit in 6 weeks. ( postpartum visit )    Specialty:  Obstetrics and Gynecology   Contact information:   [address] [place] [address] [phone]       Newborn Data: Live born female  Birth Weight: 7 lb 2.8 oz (3255 g) APGAR: 8, 9  Home with mother.  Maeola Sarah Dreshaun Stene 09/20/2013, 9:43 AM

## 2014-02-25 ENCOUNTER — Other Ambulatory Visit: Payer: Self-pay | Admitting: Obstetrics and Gynecology

## 2014-02-25 ENCOUNTER — Other Ambulatory Visit (HOSPITAL_COMMUNITY)
Admission: RE | Admit: 2014-02-25 | Discharge: 2014-02-25 | Disposition: A | Discharge status: Home / Self Care | Payer: BC Managed Care – PPO | Source: Ambulatory Visit | Attending: Obstetrics and Gynecology | Admitting: Obstetrics and Gynecology

## 2014-02-25 DIAGNOSIS — Z01419 Encounter for gynecological examination (general) (routine) without abnormal findings: Secondary | ICD-10-CM | POA: Diagnosis present

## 2014-02-26 LAB — CYTOLOGY - PAP

## 2014-04-14 ENCOUNTER — Encounter (HOSPITAL_COMMUNITY): Payer: Self-pay | Admitting: *Deleted

## 2014-10-14 ENCOUNTER — Other Ambulatory Visit: Payer: Self-pay | Admitting: Obstetrics and Gynecology

## 2014-10-14 NOTE — H&P (Signed)
Chief Complaint(s):   Preop History and Physical for 10/22/14   HPI:  General 34 y/o presents for preop history and physical examinationin preparation for robotic assisted laparoscopic hysterectomy with bilateral salpingectomy for the management of uterine fibroids. she is not interested in future fertility. she desires to preserve her ovarian function as long as they appear normal. she has had hygiene accidents. she has pain in her pelvis mid pelvis that it is relieved by otc ibuprofen.  Her ultrasound shows an 11.0 cm x 11.1 cm x 10.1 cm uterus. ovaries are normal. endometrium is 4.3 mm. 3 fibroids are measured 7.2 cm and 6.7 cm x 2.3 cm.  Current Medication:  Taking  Prenatal Vitamins Tablet as directed     Albuterol Sulfate HFA 108 (90 Base) MCG/ACT Aerosol Solution 2 puffs as needed every 4 hrs     Medication List reviewed and reconciled with the patient   Medical History:   asthma   Allergies/Intolerance:   coconut   Gyn History:   Sexual activity currently sexually active. Periods : every month. LMP 09/23/14. Birth control none. Last pap smear date 02/25/14. Denies Last mammogram date. Denies Abnormal pap smear. Denies STD. Menarche 14.   OB History:   Number of pregnancies 2. Pregnancy # 1 live birth, vaginal delivery, girl 9/23/20013 "Shanya". Pregnancy # 2 live birth, vaginal delivery, girl Rosaura Carpenter 09/18/13.   Surgical History:   No Surgical History documented.   Hospitalization:   childbirth x 2 02/2002   Family History:   Father: alive, Healthy    Mother: alive, Diabetes    Paternal Ware Mother: deceased, Lung cancer (smoker)    Maternal Grand Father: deceased, skin cancer    Maternal Grand Mother: alive, Hypertension    2daughter(s) .    Maternal Great GM-Breast Cancer.  Social History:  General Tobacco use cigarettes: Never smoked, Tobacco history last updated 10/14/2014.  no Smoking.  no Alcohol.  Caffeine: tea, soda.  no Recreational drug use.  Exercise:  yes, Zumba at home, GYM 2 x week.  Occupation: employed, Astronomer at [employer].  Marital Status: married.  Children: girls, 2.  ROS: Negative except as stated in HPI.   Objective:  Vitals:  Wt 221.4, Wt change 3.4 lb, Ht 61, BMI 41.83, Pulse sitting 77, BP sitting 128/85  Past Results:  Examination:  General Examination alert, oriented, NAD" label="GENERAL APPEARANCE" categoryPropId="10089" examid="[id number]"GENERAL APPEARANCE alert, oriented, NAD.  moist, warm" label="SKIN:" categoryPropId="10109" examid="[id number]"SKIN: moist, warm.  clear to auscultation bilaterally" label="LUNGS:" categoryPropId="87" examid="[id number]"LUNGS: clear to auscultation bilaterally.  regular rate and rhythm" label="HEART:" categoryPropId="86" examid="[id number]"HEART: regular rate and rhythm.  soft, non-tender/non-distended, bowel sounds present" label="ABDOMEN:" categoryPropId="88" examid="[id number]"ABDOMEN: soft, non-tender/non-distended, bowel sounds present.  normal external genitalia, labia - unremarkable, vagina - pink moist mucosa, no lesions or abnormal discharge, cervix - no discharge or lesions or CMT, adnexa - no masses or tenderness,  16 wk size uterus freely mobile and nontender " label="FEMALE GENITOURINARY:" categoryPropId="13414" examid="[id number]"FEMALE GENITOURINARY: normal external genitalia, labia - unremarkable, vagina - pink moist mucosa, no lesions or abnormal discharge, cervix - no discharge or lesions or CMT, adnexa - no masses or tenderness, 16 wk size uterus freely mobile and nontender .  no edema present" label="EXTREMITIES:" categoryPropId="89" examid="[id number]"EXTREMITIES: no edema present.  Physical Examination:   Assessment:  Assessment:  Fibroids - D25.9 (Primary)     Vaginal discharge - N89.8     Pelvic pain - R10.2     Menorrhagia with irregular cycle - N92.1     Plan:  Treatment:  Fibroids  Notes: d/w pt robotic assisted laparoscpic hysterectomy with bilateral salpingectomy  .. r/b/a of surgery were discussed with the patient including but not limited to infection/ bleeding / damage to bowel bladder ureters with the need for further surgery. r/o transfusion discussed. pt voiced understanding and desires to proceed with robotic assited laporoscopic hysterectomy .  Vaginal discharge  Lab:[facility]  Procedures:  Immunizations:  Therapeutic Injections:  Diagnostic Imaging:  Lab Reports:  Lab:[facility]  WBCS -  Normal  -   CLUE CELLS -  None seen  -   TRICHOMONAS -  None Seen  -   YEAST -  None seen  -

## 2014-10-15 ENCOUNTER — Encounter (HOSPITAL_COMMUNITY): Payer: Self-pay

## 2014-10-15 ENCOUNTER — Encounter (HOSPITAL_COMMUNITY)
Admission: RE | Admit: 2014-10-15 | Discharge: 2014-10-15 | Disposition: A | Discharge status: Home / Self Care | Payer: BLUE CROSS/BLUE SHIELD | Source: Ambulatory Visit | Attending: Obstetrics and Gynecology | Admitting: Obstetrics and Gynecology

## 2014-10-15 DIAGNOSIS — D259 Leiomyoma of uterus, unspecified: Secondary | ICD-10-CM | POA: Diagnosis not present

## 2014-10-15 DIAGNOSIS — Z01812 Encounter for preprocedural laboratory examination: Secondary | ICD-10-CM | POA: Insufficient documentation

## 2014-10-15 HISTORY — DX: Gastro-esophageal reflux disease without esophagitis: K21.9

## 2014-10-15 HISTORY — DX: Anemia, unspecified: D64.9

## 2014-10-15 LAB — CBC
HCT: 36.3 % (ref 36.0–46.0)
Hemoglobin: 12.4 g/dL (ref 12.0–15.0)
MCH: 29 pg (ref 26.0–34.0)
MCHC: 34.2 g/dL (ref 30.0–36.0)
MCV: 85 fL (ref 78.0–100.0)
Platelets: 369 10*3/uL (ref 150–400)
RBC: 4.27 MIL/uL (ref 3.87–5.11)
RDW: 13.3 % (ref 11.5–15.5)
WBC: 9 10*3/uL (ref 4.0–10.5)

## 2014-10-15 LAB — TYPE AND SCREEN
ABO/RH(D): O POS
Antibody Screen: NEGATIVE

## 2014-10-15 IMAGING — US US OB COMP LESS 14 WK
1 series · 14 of 28 positions shown · non-contrast
Comparison: none

[Series 1: us ob comp less 14 wks · 52 acquisitions, 14 frames shown]
[im 2/52]
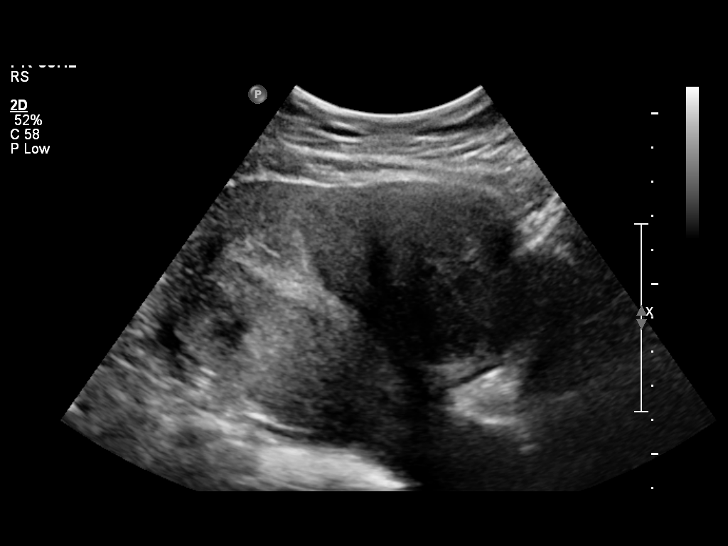
[im 6/52]
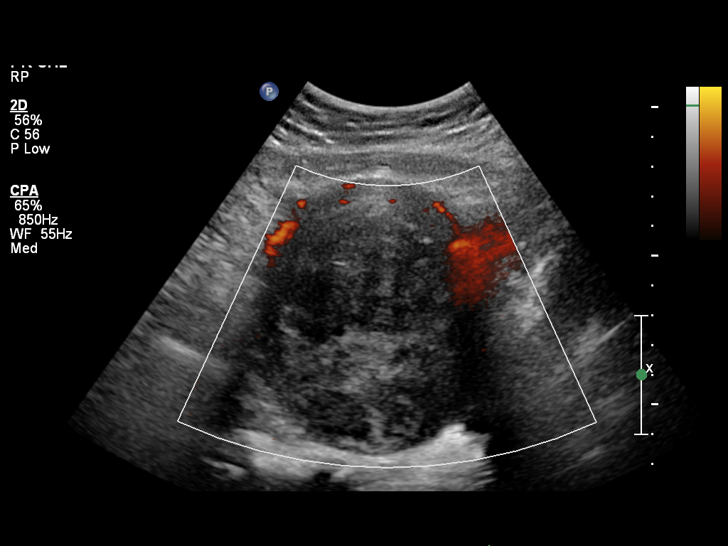
[im 10/52]
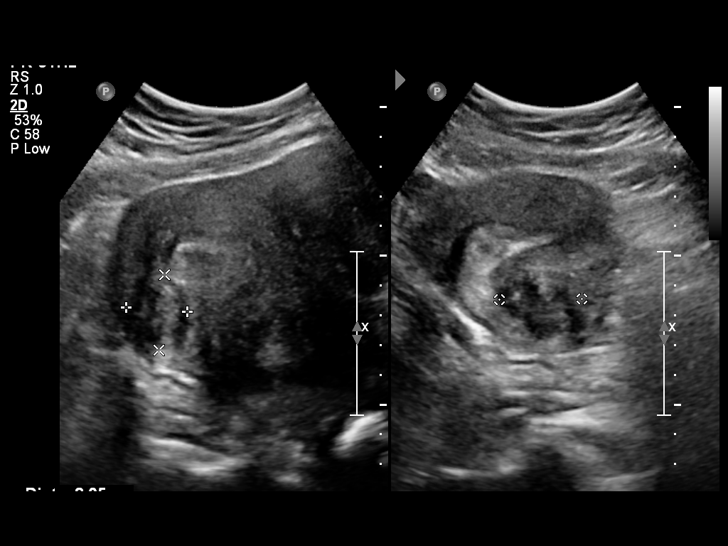
[im 14/52]
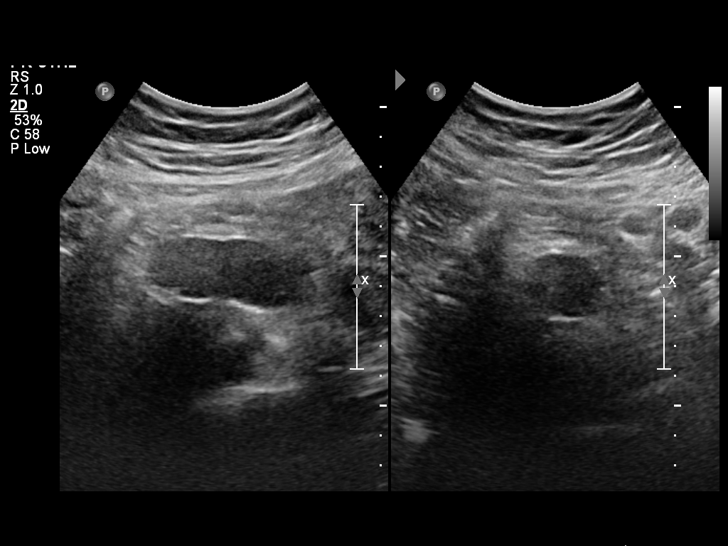
[im 18/52]
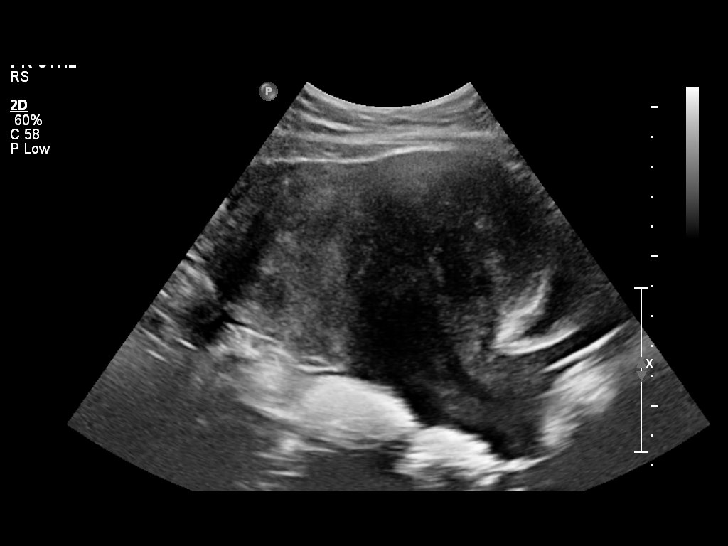
[im 21/52]
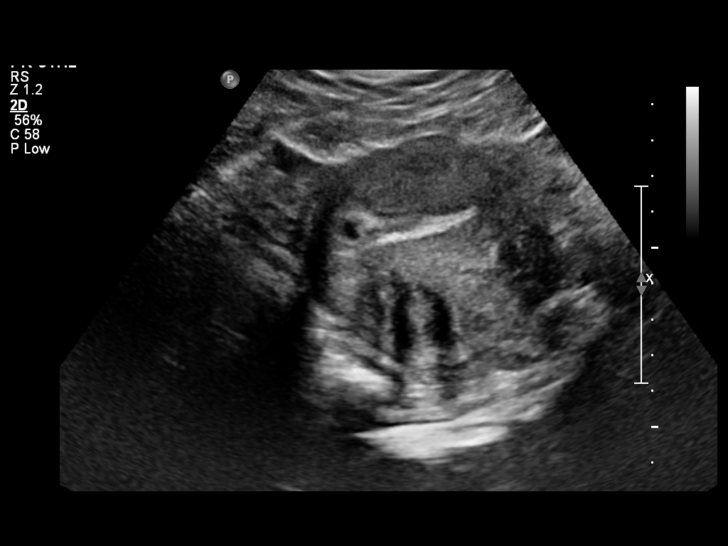
[im 25/52]
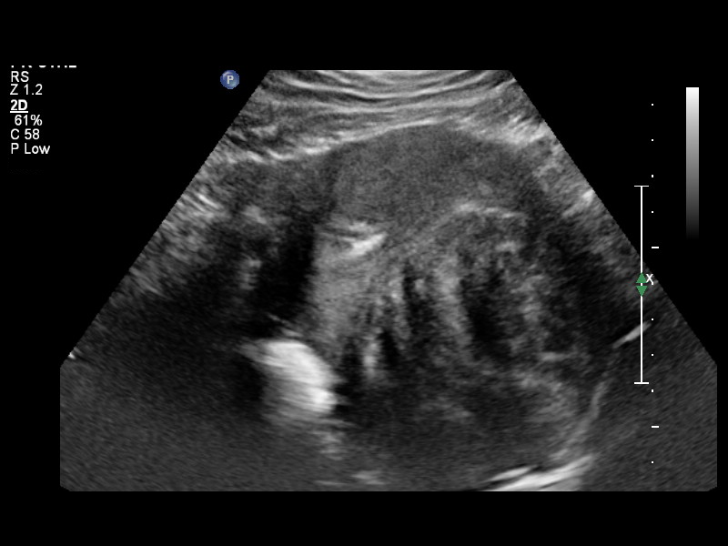
[im 29/52]
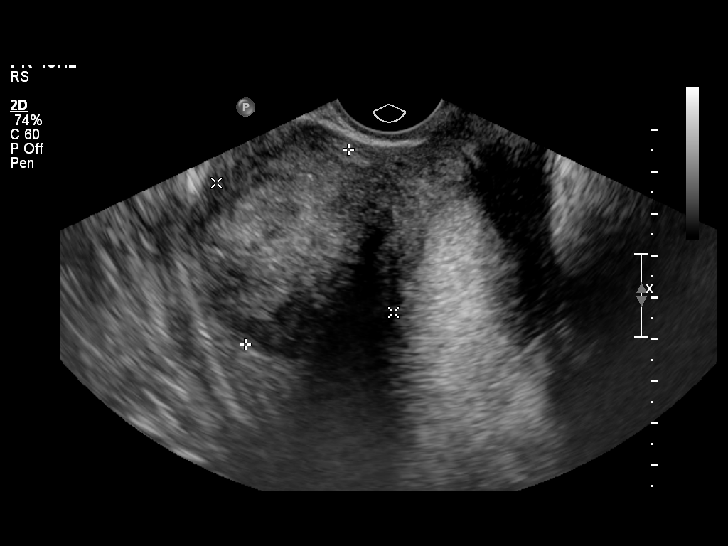
[im 33/52]
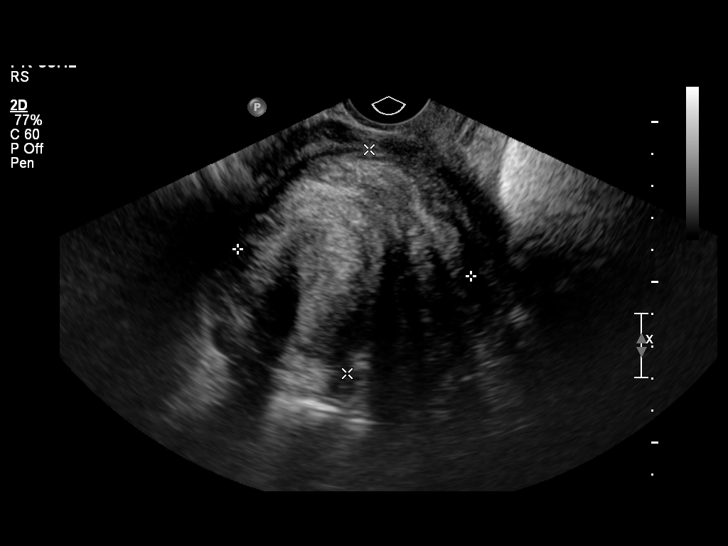
[im 36/52]
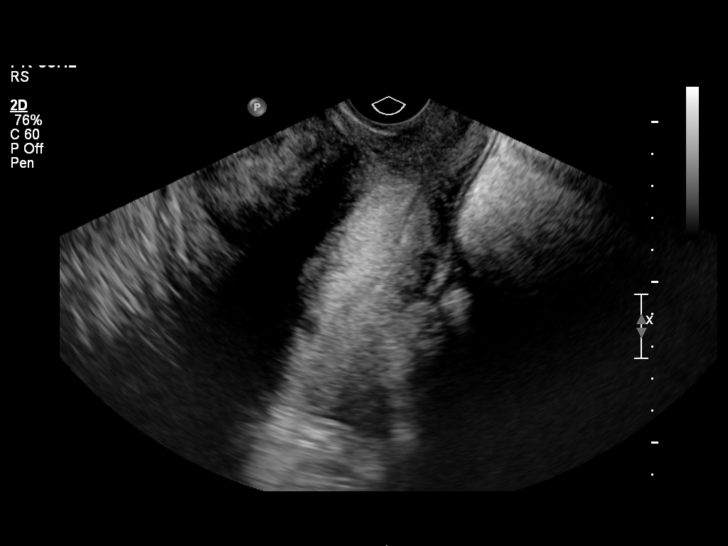
[im 40/52]
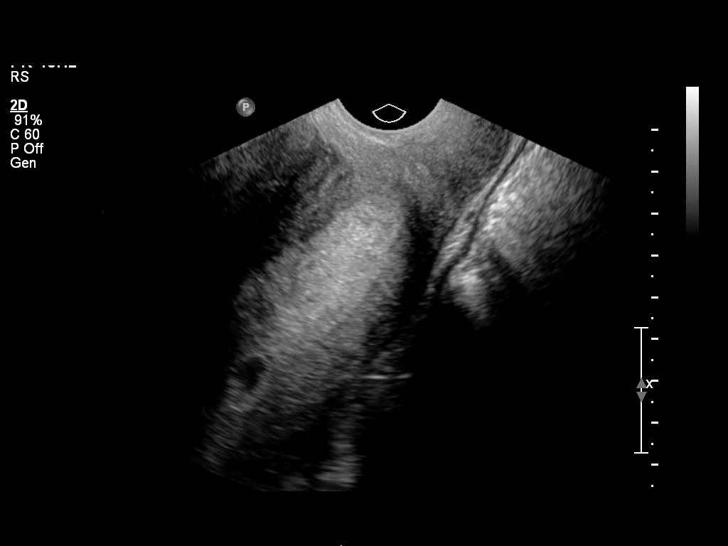
[im 44/52]
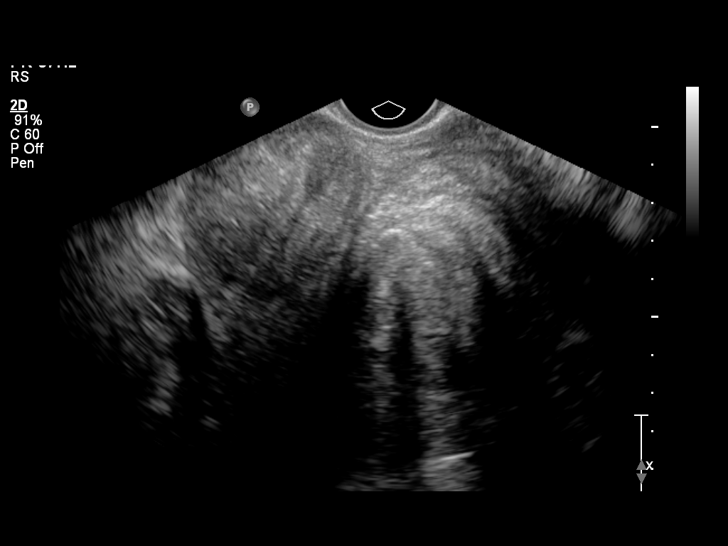
[im 48/52]
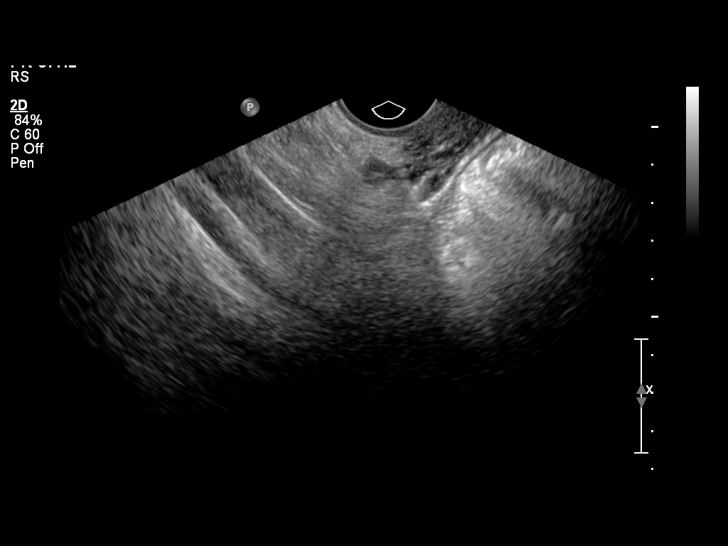
[im 52/52]
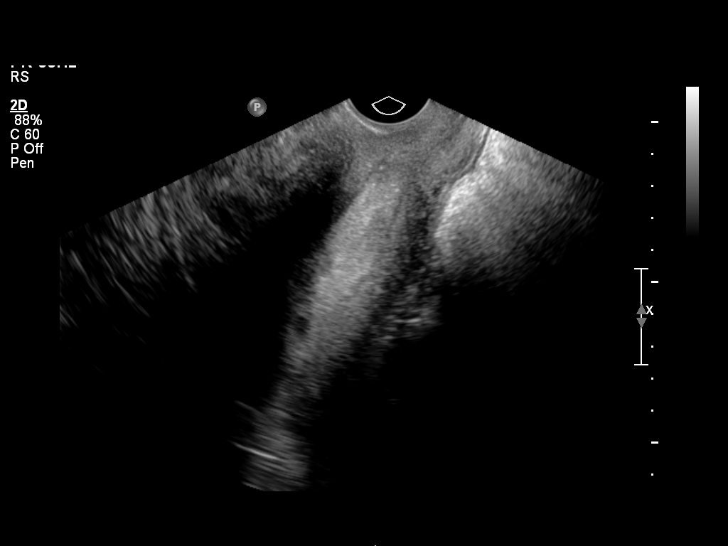

[14 of 28 positions shown; findings below may reference images not displayed]

OBSTETRICS REPORT
                      (Signed Final 01/30/2013 [DATE])

Service(s) Provided

 US OB COMP LESS 14 WKS                                76801.0
 US OB TRANSVAGINAL                                    76817.0
Indications

 Pain - LLQ
Fetal Evaluation

 Num Of Fetuses:    1
 Gest. Sac:         Probable early IUGS
 Yolk Sac:          Not visualized
 Fetal Pole:        Not visualized
 Cardiac Activity:  No embryo visualized

 Comment:    HCG today is 5538.
Biometry

 GS:         6  mm    G. Age:   5w 1d                  EDD:   10/01/13
Gestational Age

 LMP:           5w 2d        Date:   12/24/12                 EDD:   09/30/13
 Best:          5w 2d     Det. By:   LMP  (12/24/12)          EDD:   09/30/13
Myomas

 Site                     L(cm)      W(cm)      D(cm)       Location
 Anterior
 Posterior
 Fundus

 Blood Flow                  RI       PI       Comments

Impression

 Probable early, approximately 5wk IUGS.  No adnexal mass
 identifed.  Recommended followup of B-HCG levels, and f/u
 US in 7-10 days from now to assess pregnancy
 progression/viability.
 Multiple uterine fibroids as noted above.

## 2014-10-15 NOTE — Patient Instructions (Addendum)
Your procedure is scheduled on:  Oct 22, 2014  Enter through the Main Entrance of [facility] at: 0700 am   Pick up the phone at the desk and dial [phone].  Call this number if you have problems the morning of surgery: [phone].  Remember: Do NOT eat food: after midnight on Tuesday  Do NOT drink clear liquids after:  midnight on Tuesday Take these medicines the morning of surgery with a SIP OF WATER: None  * Bring inhaler day of surgery.   Do NOT wear jewelry (body piercing), metal hair clips/bobby pins, make up, or nail polish. Do NOT wear lotions, powders, or perfumes.  You may wear deoderant. Do NOT shave for 48 hours prior to surgery. Do NOT bring valuables to the hospital. Contacts, dentures, or bridgework may not be worn into surgery. Leave suitcase in car.  After surgery it may be brought to your room.  For patients admitted to the hospital, checkout time is 11:00 AM the day of discharge.

## 2014-10-16 IMAGING — US US OB TRANSVAGINAL
1 series · 13 of 28 positions shown · non-contrast
Comparison: 01/30/2013

CLINICAL DATA: Severe abdominal pain.  Previously scanned
yesterday.  Estimated gestational age by LMP is 5 weeks 2 days.
Quantitative beta HCG is 3332

OBSTETRIC <14 WK US AND TRANSVAGINAL OB US
TECHNIQUE: Both transabdominal and transvaginal ultrasound
examinations were performed for complete evaluation of the
gestation as well as the maternal uterus, adnexal regions, and
pelvic cul-de-sac.  Transvaginal technique was performed to assess
early pregnancy.

[Series 1: us ob transvaginal · 13 of 48 slices shown]
[im 2/48]
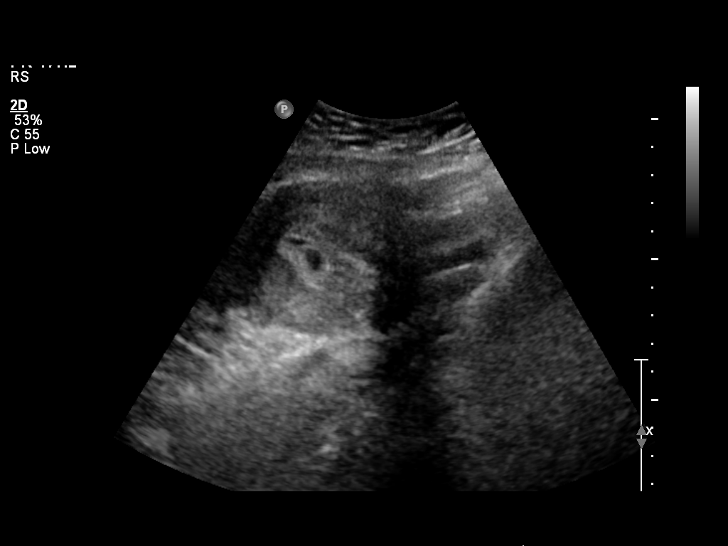
[im 6/48]
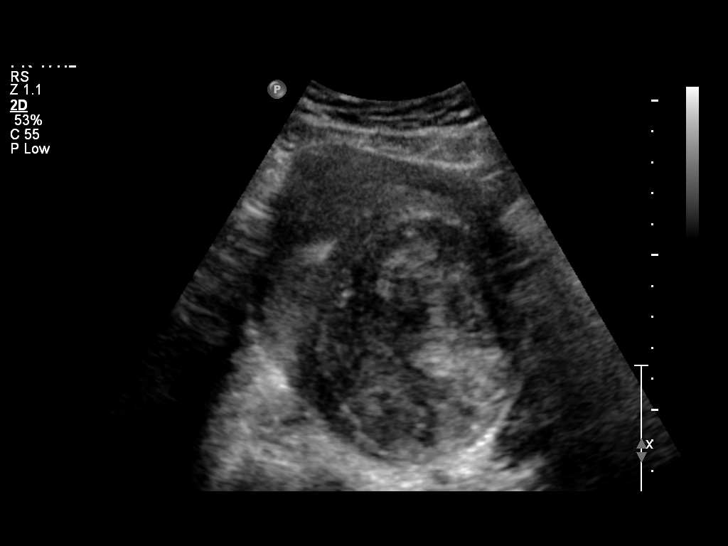
[im 9/48]
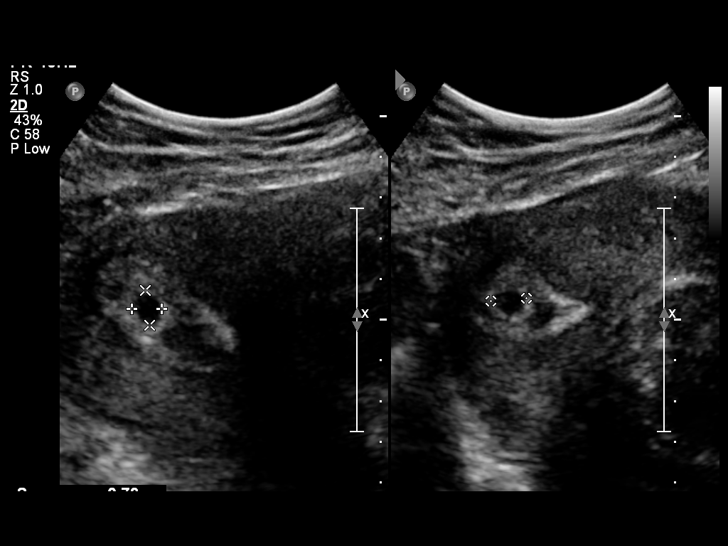
[im 13/48]
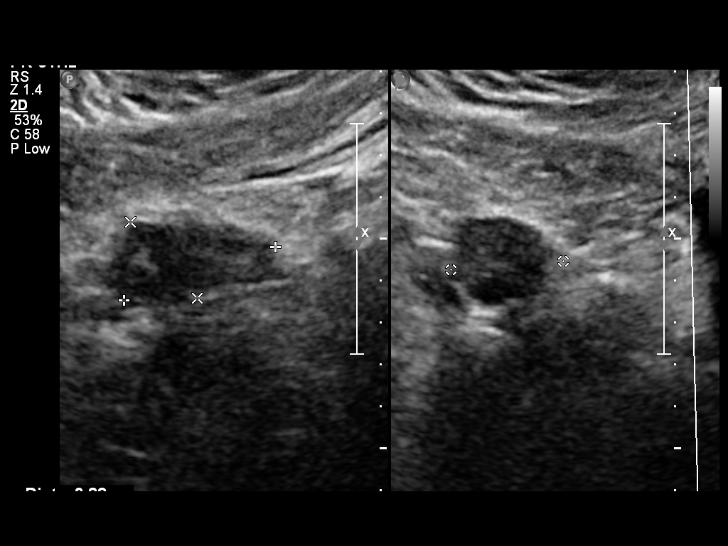
[im 16/48]
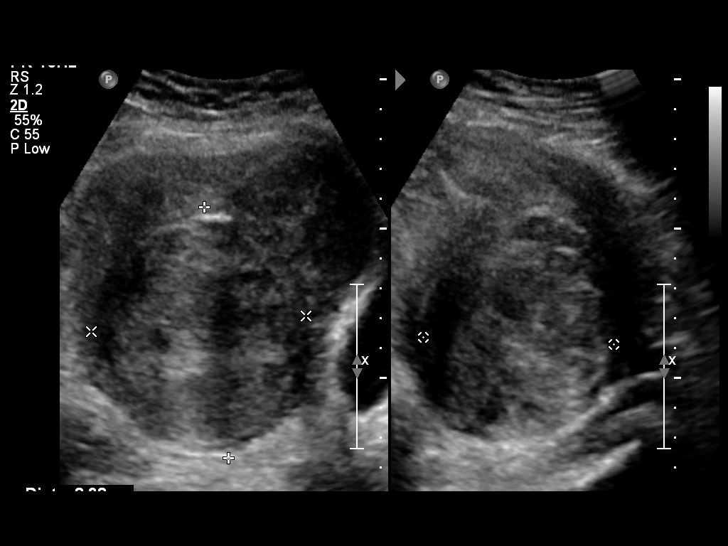
[im 20/48]
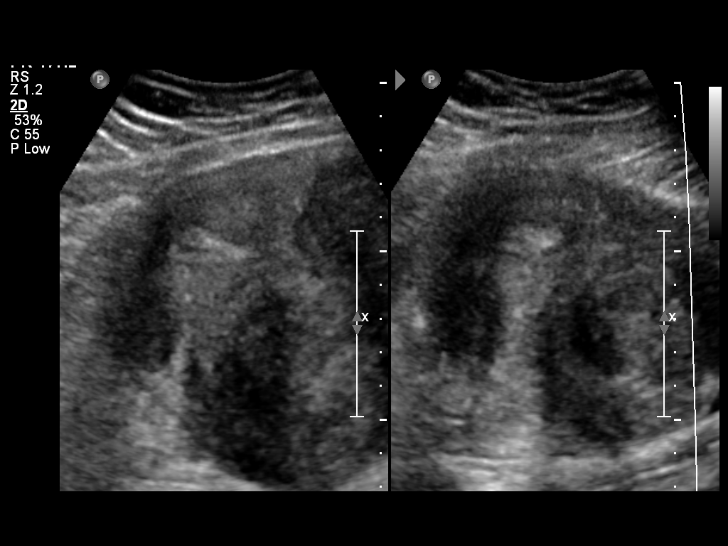
[im 25/48]
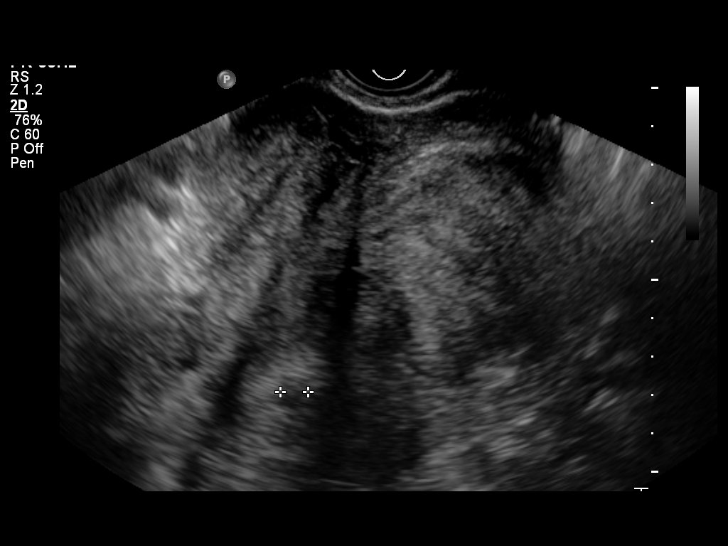
[im 28/48]
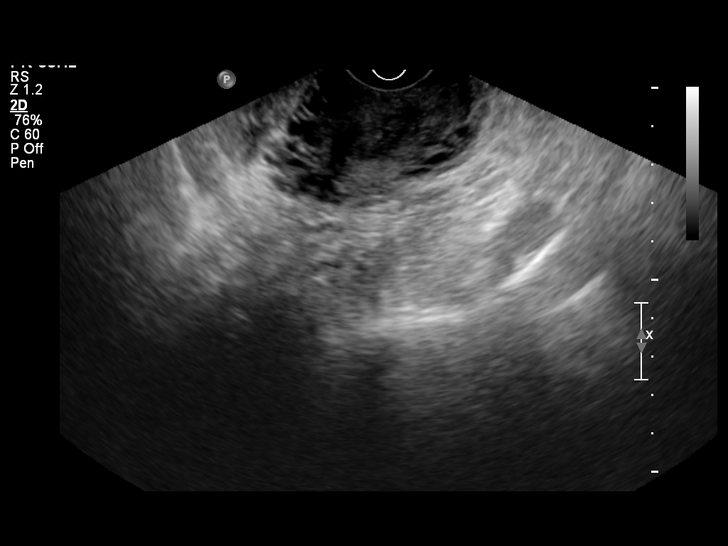
[im 32/48]
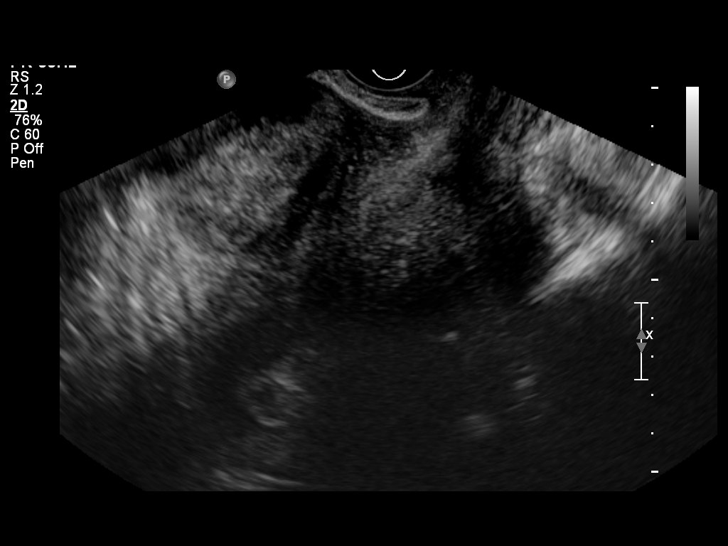
[im 35/48]
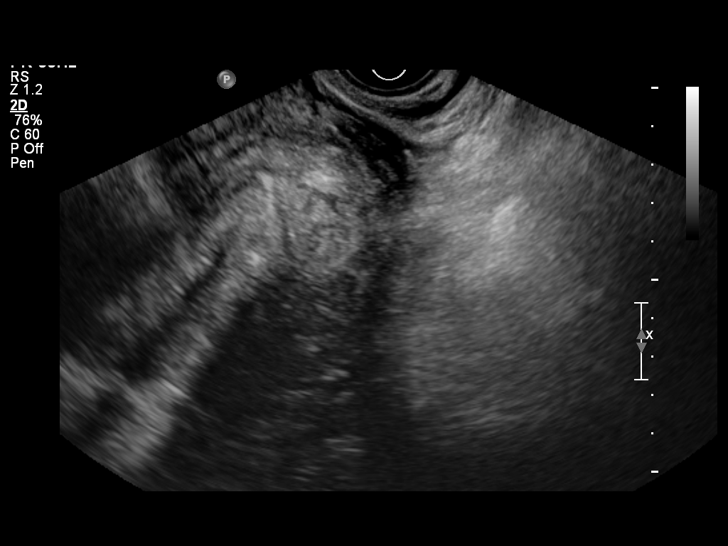
[im 39/48]
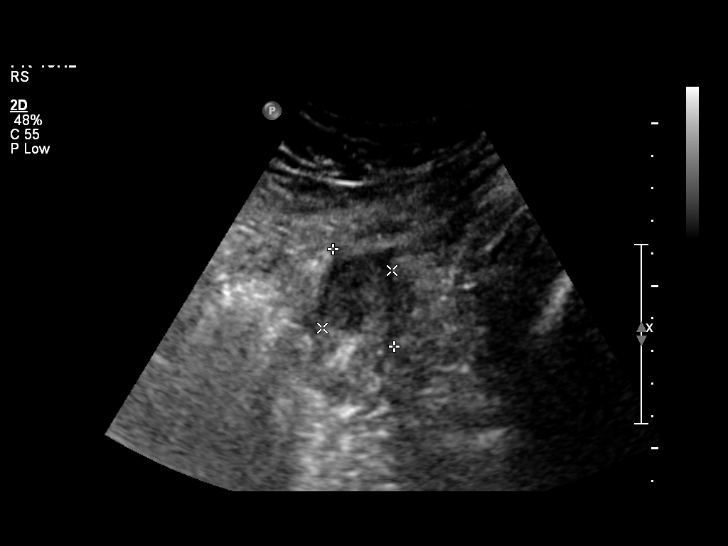
[im 42/48]
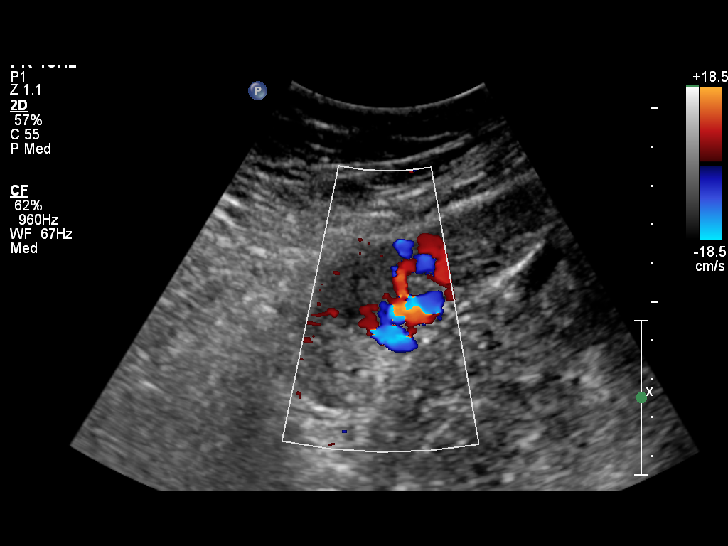
[im 46/48]
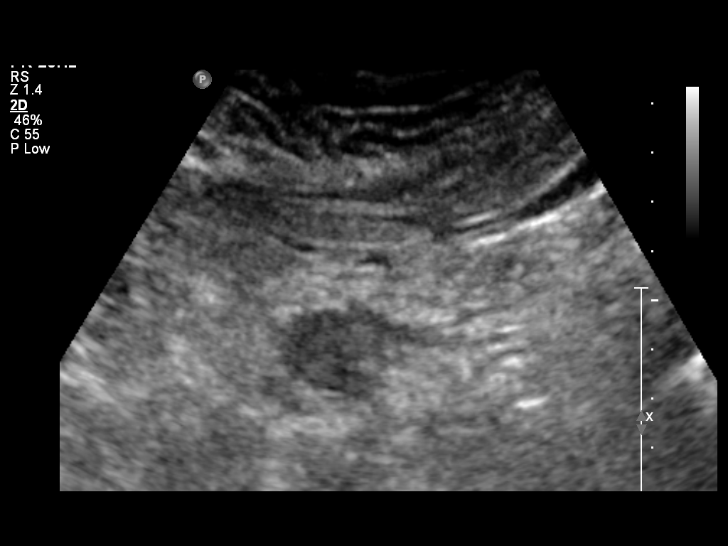

[13 of 28 positions shown; findings below may reference images not displayed]

Intrauterine gestational sac:  No focal intrauterine cystic
collection is identified, likely representing an intrauterine
gestational sac.
Yolk sac: Not visualized
Embryo: Not visualized
Cardiac Activity: Not visualized

MSD: 0.82  mm  5 w 3 d        US EDC: 09/30/2013

Maternal uterus/adnexae:
Multiple large uterine fibroids are present, limiting visualization
of the endometrium.  Focal fibroids are demonstrated measuring
x 4.8 x 5.6 cm anteriorly, 8.4 x 7.2 x 6.3 cm posteriorly, and
x 2.6 x 2.4 cm in the fundus.  Both ovaries are visualized without
abnormal adnexal mass identified.  Stable appearance since previous
study.
IMPRESSION: Probable early intrauterine gestational sac, but no yolk sac, fetal
pole, or cardiac activity yet visualized.  Recommend follow-up
quantitative B-HCG levels and follow-up US in 14 days to confirm
and assess viability.

## 2014-10-21 ENCOUNTER — Other Ambulatory Visit: Payer: Self-pay | Admitting: Obstetrics and Gynecology

## 2014-10-22 ENCOUNTER — Observation Stay (HOSPITAL_COMMUNITY)
Admission: AD | Admit: 2014-10-22 | Discharge: 2014-10-23 | Disposition: A | Discharge status: Home / Self Care | Payer: BLUE CROSS/BLUE SHIELD | Source: Ambulatory Visit | Attending: Obstetrics and Gynecology | Admitting: Obstetrics and Gynecology

## 2014-10-22 ENCOUNTER — Ambulatory Visit (HOSPITAL_COMMUNITY): Payer: BLUE CROSS/BLUE SHIELD | Admitting: Anesthesiology

## 2014-10-22 ENCOUNTER — Encounter (HOSPITAL_COMMUNITY)
Admission: AD | Disposition: A | Discharge status: Home / Self Care | Payer: Self-pay | Source: Ambulatory Visit | Attending: Obstetrics and Gynecology

## 2014-10-22 ENCOUNTER — Encounter (HOSPITAL_COMMUNITY): Payer: Self-pay | Admitting: Anesthesiology

## 2014-10-22 DIAGNOSIS — J45909 Unspecified asthma, uncomplicated: Secondary | ICD-10-CM | POA: Insufficient documentation

## 2014-10-22 DIAGNOSIS — D259 Leiomyoma of uterus, unspecified: Secondary | ICD-10-CM | POA: Insufficient documentation

## 2014-10-22 DIAGNOSIS — N920 Excessive and frequent menstruation with regular cycle: Principal | ICD-10-CM | POA: Insufficient documentation

## 2014-10-22 DIAGNOSIS — Z90710 Acquired absence of both cervix and uterus: Secondary | ICD-10-CM | POA: Diagnosis present

## 2014-10-22 DIAGNOSIS — N840 Polyp of corpus uteri: Secondary | ICD-10-CM | POA: Diagnosis not present

## 2014-10-22 DIAGNOSIS — Z09 Encounter for follow-up examination after completed treatment for conditions other than malignant neoplasm: Secondary | ICD-10-CM

## 2014-10-22 DIAGNOSIS — Z6841 Body Mass Index (BMI) 40.0 and over, adult: Secondary | ICD-10-CM | POA: Insufficient documentation

## 2014-10-22 HISTORY — PX: ROBOTIC ASSISTED TOTAL HYSTERECTOMY WITH BILATERAL SALPINGO OOPHERECTOMY: SHX6086

## 2014-10-22 LAB — PREGNANCY, URINE: Preg Test, Ur: NEGATIVE

## 2014-10-22 SURGERY — ROBOTIC ASSISTED TOTAL HYSTERECTOMY WITH BILATERAL SALPINGO OOPHORECTOMY
Anesthesia: General | Site: Abdomen | Laterality: Bilateral

## 2014-10-22 MED ORDER — MIDAZOLAM HCL 2 MG/2ML IJ SOLN
INTRAMUSCULAR | Status: AC
  Filled 2014-10-22: qty 2

## 2014-10-22 MED ORDER — ONDANSETRON HCL 4 MG/2ML IJ SOLN: 4.0000 mg | Freq: Four times a day (QID) | INTRAMUSCULAR | Status: DC | PRN

## 2014-10-22 MED ORDER — ONDANSETRON HCL 4 MG/2ML IJ SOLN
INTRAMUSCULAR | Status: AC
  Filled 2014-10-22: qty 2

## 2014-10-22 MED ORDER — DEXAMETHASONE SODIUM PHOSPHATE 4 MG/ML IJ SOLN
INTRAMUSCULAR | Status: AC
  Filled 2014-10-22: qty 1

## 2014-10-22 MED ORDER — CEFAZOLIN SODIUM-DEXTROSE 2-3 GM-% IV SOLR: 2.0000 g | INTRAVENOUS | Status: DC

## 2014-10-22 MED ORDER — METHYLENE BLUE 1 % INJ SOLN
INTRAMUSCULAR | Status: DC | PRN
  Administered 2014-10-22: 1 mL

## 2014-10-22 MED ORDER — HYDROMORPHONE HCL 1 MG/ML IJ SOLN
INTRAMUSCULAR | Status: AC
Start: 2014-10-22 — End: 2014-10-22
  Filled 2014-10-22: qty 1

## 2014-10-22 MED ORDER — PROPOFOL 10 MG/ML IV BOLUS
INTRAVENOUS | Status: DC | PRN
  Administered 2014-10-22: 200 mg via INTRAVENOUS

## 2014-10-22 MED ORDER — ONDANSETRON HCL 4 MG PO TABS: 4.0000 mg | ORAL_TABLET | Freq: Four times a day (QID) | ORAL | Status: DC | PRN

## 2014-10-22 MED ORDER — LIDOCAINE HCL (CARDIAC) 20 MG/ML IV SOLN
INTRAVENOUS | Status: DC | PRN
  Administered 2014-10-22: 30 mg via INTRAVENOUS
  Administered 2014-10-22: 70 mg via INTRAVENOUS

## 2014-10-22 MED ORDER — FENTANYL CITRATE (PF) 100 MCG/2ML IJ SOLN
INTRAMUSCULAR | Status: AC
  Filled 2014-10-22: qty 2

## 2014-10-22 MED ORDER — LACTATED RINGERS IV SOLN
INTRAVENOUS | Status: DC
  Administered 2014-10-22 (×3): via INTRAVENOUS

## 2014-10-22 MED ORDER — SCOPOLAMINE 1 MG/3DAYS TD PT72
MEDICATED_PATCH | TRANSDERMAL | Status: AC
  Filled 2014-10-22: qty 1

## 2014-10-22 MED ORDER — SENNA 8.6 MG PO TABS
1.0000 | ORAL_TABLET | Freq: Two times a day (BID) | ORAL | Status: DC
  Administered 2014-10-22 – 2014-10-23 (×2): 8.6 mg via ORAL
  Filled 2014-10-22 (×2): qty 1

## 2014-10-22 MED ORDER — ROCURONIUM BROMIDE 100 MG/10ML IV SOLN
INTRAVENOUS | Status: DC | PRN
  Administered 2014-10-22 (×2): 10 mg via INTRAVENOUS
  Administered 2014-10-22: 50 mg via INTRAVENOUS
  Administered 2014-10-22 (×3): 10 mg via INTRAVENOUS

## 2014-10-22 MED ORDER — OXYCODONE HCL 5 MG/5ML PO SOLN: 5.0000 mg | Freq: Once | ORAL | Status: DC | PRN

## 2014-10-22 MED ORDER — IBUPROFEN 800 MG PO TABS
800.0000 mg | ORAL_TABLET | Freq: Three times a day (TID) | ORAL | Status: DC | PRN
  Administered 2014-10-23 (×2): 800 mg via ORAL
  Filled 2014-10-22 (×2): qty 1

## 2014-10-22 MED ORDER — FENTANYL CITRATE (PF) 100 MCG/2ML IJ SOLN
INTRAMUSCULAR | Status: DC | PRN
  Administered 2014-10-22 (×4): 50 ug via INTRAVENOUS
  Administered 2014-10-22: 100 ug via INTRAVENOUS
  Administered 2014-10-22 (×2): 50 ug via INTRAVENOUS
  Administered 2014-10-22: 100 ug via INTRAVENOUS
  Administered 2014-10-22: 50 ug via INTRAVENOUS

## 2014-10-22 MED ORDER — FENTANYL CITRATE (PF) 250 MCG/5ML IJ SOLN
INTRAMUSCULAR | Status: AC
  Filled 2014-10-22: qty 5

## 2014-10-22 MED ORDER — GLYCOPYRROLATE 0.2 MG/ML IJ SOLN
INTRAMUSCULAR | Status: DC | PRN
  Administered 2014-10-22: 0.6 mg via INTRAVENOUS

## 2014-10-22 MED ORDER — HYDROMORPHONE HCL 1 MG/ML IJ SOLN
INTRAMUSCULAR | Status: AC
  Filled 2014-10-22: qty 1

## 2014-10-22 MED ORDER — DEXAMETHASONE SODIUM PHOSPHATE 10 MG/ML IJ SOLN
INTRAMUSCULAR | Status: DC | PRN
  Administered 2014-10-22: 4 mg via INTRAVENOUS

## 2014-10-22 MED ORDER — SCOPOLAMINE 1 MG/3DAYS TD PT72
1.0000 | MEDICATED_PATCH | Freq: Once | TRANSDERMAL | Status: DC
  Administered 2014-10-22: 1.5 mg via TRANSDERMAL

## 2014-10-22 MED ORDER — SODIUM CHLORIDE 0.9 % IV SOLN
INTRAVENOUS | Status: DC | PRN
  Administered 2014-10-22: 120 mL

## 2014-10-22 MED ORDER — MENTHOL 3 MG MT LOZG
1.0000 | LOZENGE | OROMUCOSAL | Status: DC | PRN
  Administered 2014-10-23: 3 mg via ORAL
  Filled 2014-10-22: qty 9

## 2014-10-22 MED ORDER — ONDANSETRON HCL 4 MG/2ML IJ SOLN
INTRAMUSCULAR | Status: DC | PRN
  Administered 2014-10-22 (×2): 4 mg via INTRAVENOUS

## 2014-10-22 MED ORDER — HYDROMORPHONE HCL 1 MG/ML IJ SOLN
0.2500 mg | INTRAMUSCULAR | Status: DC | PRN
  Administered 2014-10-22: 0.5 mg via INTRAVENOUS

## 2014-10-22 MED ORDER — NEOSTIGMINE METHYLSULFATE 10 MG/10ML IV SOLN
INTRAVENOUS | Status: DC | PRN
  Administered 2014-10-22: 4 mg via INTRAVENOUS

## 2014-10-22 MED ORDER — SIMETHICONE 80 MG PO CHEW: 80.0000 mg | CHEWABLE_TABLET | Freq: Four times a day (QID) | ORAL | Status: DC | PRN

## 2014-10-22 MED ORDER — GLYCOPYRROLATE 0.2 MG/ML IJ SOLN
INTRAMUSCULAR | Status: AC
  Filled 2014-10-22: qty 3

## 2014-10-22 MED ORDER — ARTIFICIAL TEARS OP OINT
TOPICAL_OINTMENT | OPHTHALMIC | Status: AC
  Filled 2014-10-22: qty 3.5

## 2014-10-22 MED ORDER — ALBUTEROL SULFATE HFA 108 (90 BASE) MCG/ACT IN AERS
INHALATION_SPRAY | RESPIRATORY_TRACT | Status: DC | PRN
  Administered 2014-10-22: 2 via RESPIRATORY_TRACT

## 2014-10-22 MED ORDER — STERILE WATER FOR IRRIGATION IR SOLN
Status: DC | PRN
  Administered 2014-10-22: 1000 mL via INTRAVESICAL

## 2014-10-22 MED ORDER — ACETAMINOPHEN 160 MG/5ML PO SOLN: 325.0000 mg | ORAL | Status: DC | PRN

## 2014-10-22 MED ORDER — PROPOFOL 10 MG/ML IV BOLUS
INTRAVENOUS | Status: AC
  Filled 2014-10-22: qty 20

## 2014-10-22 MED ORDER — SODIUM CHLORIDE 0.9 % IJ SOLN
INTRAMUSCULAR | Status: AC
  Filled 2014-10-22: qty 10

## 2014-10-22 MED ORDER — LACTATED RINGERS IV SOLN
INTRAVENOUS | Status: DC
  Administered 2014-10-22 – 2014-10-23 (×2): via INTRAVENOUS

## 2014-10-22 MED ORDER — NEOSTIGMINE METHYLSULFATE 10 MG/10ML IV SOLN
INTRAVENOUS | Status: AC
  Filled 2014-10-22: qty 1

## 2014-10-22 MED ORDER — KETOROLAC TROMETHAMINE 30 MG/ML IJ SOLN
INTRAMUSCULAR | Status: AC
  Filled 2014-10-22: qty 1

## 2014-10-22 MED ORDER — ROCURONIUM BROMIDE 100 MG/10ML IV SOLN
INTRAVENOUS | Status: AC
  Filled 2014-10-22: qty 1

## 2014-10-22 MED ORDER — OXYCODONE HCL 5 MG PO TABS: 5.0000 mg | ORAL_TABLET | Freq: Once | ORAL | Status: DC | PRN

## 2014-10-22 MED ORDER — CEFAZOLIN SODIUM-DEXTROSE 2-3 GM-% IV SOLR
INTRAVENOUS | Status: AC
  Administered 2014-10-22 (×2): 2 g via INTRAVENOUS
  Filled 2014-10-22: qty 50

## 2014-10-22 MED ORDER — KETOROLAC TROMETHAMINE 30 MG/ML IJ SOLN
INTRAMUSCULAR | Status: DC | PRN
  Administered 2014-10-22: 30 mg via INTRAVENOUS

## 2014-10-22 MED ORDER — METHYLENE BLUE 1 % INJ SOLN
INTRAMUSCULAR | Status: AC
  Filled 2014-10-22: qty 1

## 2014-10-22 MED ORDER — OXYCODONE-ACETAMINOPHEN 5-325 MG PO TABS: 1.0000 | ORAL_TABLET | ORAL | Status: DC | PRN

## 2014-10-22 MED ORDER — SODIUM CHLORIDE 0.9 % IJ SOLN
INTRAMUSCULAR | Status: AC
  Filled 2014-10-22: qty 50

## 2014-10-22 MED ORDER — HYDROMORPHONE HCL 1 MG/ML IJ SOLN
INTRAMUSCULAR | Status: DC | PRN
  Administered 2014-10-22: 1 mg via INTRAVENOUS

## 2014-10-22 MED ORDER — ROPIVACAINE HCL 5 MG/ML IJ SOLN
INTRAMUSCULAR | Status: AC
  Filled 2014-10-22: qty 60

## 2014-10-22 MED ORDER — ACETAMINOPHEN 325 MG PO TABS: 325.0000 mg | ORAL_TABLET | ORAL | Status: DC | PRN

## 2014-10-22 MED ORDER — LIDOCAINE HCL (CARDIAC) 20 MG/ML IV SOLN
INTRAVENOUS | Status: AC
  Filled 2014-10-22: qty 5

## 2014-10-22 MED ORDER — MIDAZOLAM HCL 2 MG/2ML IJ SOLN
INTRAMUSCULAR | Status: DC | PRN
  Administered 2014-10-22: 2 mg via INTRAVENOUS

## 2014-10-22 MED ORDER — LACTATED RINGERS IR SOLN
Status: DC | PRN
Start: 2014-10-22 — End: 2014-10-22
  Administered 2014-10-22: 3000 mL

## 2014-10-22 MED ORDER — HYDROMORPHONE HCL 1 MG/ML IJ SOLN
0.2000 mg | INTRAMUSCULAR | Status: DC | PRN
  Administered 2014-10-22: 0.5 mg via INTRAVENOUS
  Administered 2014-10-22 – 2014-10-23 (×2): 0.4 mg via INTRAVENOUS
  Filled 2014-10-22 (×3): qty 1

## 2014-10-22 MED ORDER — ALBUTEROL SULFATE (2.5 MG/3ML) 0.083% IN NEBU: 3.0000 mL | INHALATION_SOLUTION | Freq: Four times a day (QID) | RESPIRATORY_TRACT | Status: DC | PRN

## 2014-10-22 MED ORDER — CEFAZOLIN SODIUM-DEXTROSE 2-3 GM-% IV SOLR
INTRAVENOUS | Status: AC
  Filled 2014-10-22: qty 50

## 2014-10-22 MED ORDER — ALUM & MAG HYDROXIDE-SIMETH 200-200-20 MG/5ML PO SUSP: 30.0000 mL | ORAL | Status: DC | PRN

## 2014-10-22 SURGICAL SUPPLY — 67 items
APL SKNCLS STERI-STRIP NONHPOA (GAUZE/BANDAGES/DRESSINGS) ×1
BAG URINE DRAINAGE (UROLOGICAL SUPPLIES) ×1 IMPLANT
BARRIER ADHS 3X4 INTERCEED (GAUZE/BANDAGES/DRESSINGS) ×1 IMPLANT
BENZOIN TINCTURE PRP APPL 2/3 (GAUZE/BANDAGES/DRESSINGS) ×2 IMPLANT
BRR ADH 4X3 ABS CNTRL BYND (GAUZE/BANDAGES/DRESSINGS)
CATH FOLEY 3WAY  5CC 16FR (CATHETERS) ×1
CATH FOLEY 3WAY 5CC 16FR (CATHETERS) ×1 IMPLANT
CONT PATH 16OZ SNAP LID 3702 (MISCELLANEOUS) ×2 IMPLANT
COVER BACK TABLE 60X90IN (DRAPES) ×4 IMPLANT
COVER TIP SHEARS 8 DVNC (MISCELLANEOUS) ×1 IMPLANT
COVER TIP SHEARS 8MM DA VINCI (MISCELLANEOUS) ×1
DECANTER SPIKE VIAL GLASS SM (MISCELLANEOUS) ×2 IMPLANT
DILATOR CANAL MILEX (MISCELLANEOUS) ×1 IMPLANT
DRAPE WARM FLUID 44X44 (DRAPE) ×2 IMPLANT
DRSG COVADERM PLUS 2X2 (GAUZE/BANDAGES/DRESSINGS) ×8 IMPLANT
DRSG OPSITE POSTOP 3X4 (GAUZE/BANDAGES/DRESSINGS) ×2 IMPLANT
DRSG TEGADERM 8X12 (GAUZE/BANDAGES/DRESSINGS) ×1 IMPLANT
DURAPREP 26ML APPLICATOR (WOUND CARE) ×2 IMPLANT
ELECT REM PT RETURN 9FT ADLT (ELECTROSURGICAL) ×2
ELECTRODE REM PT RTRN 9FT ADLT (ELECTROSURGICAL) ×1 IMPLANT
GAUZE SPONGE 4X4 16PLY XRAY LF (GAUZE/BANDAGES/DRESSINGS) ×1 IMPLANT
GAUZE VASELINE 3X9 (GAUZE/BANDAGES/DRESSINGS) IMPLANT
GLOVE BIOGEL M 6.5 STRL (GLOVE) ×7 IMPLANT
GLOVE BIOGEL PI IND STRL 6.5 (GLOVE) ×1 IMPLANT
GLOVE BIOGEL PI IND STRL 7.0 (GLOVE) ×4 IMPLANT
GLOVE BIOGEL PI INDICATOR 6.5 (GLOVE) ×1
GLOVE BIOGEL PI INDICATOR 7.0 (GLOVE) ×4
GLOVE ECLIPSE 7.0 STRL STRAW (GLOVE) ×1 IMPLANT
GLOVE INDICATOR 6.5 STRL GRN (GLOVE) ×1 IMPLANT
GLOVE INDICATOR 7.0 STRL GRN (GLOVE) ×1 IMPLANT
KIT ACCESSORY DA VINCI DISP (KITS) ×1
KIT ACCESSORY DVNC DISP (KITS) ×1 IMPLANT
LEGGING LITHOTOMY PAIR STRL (DRAPES) ×2 IMPLANT
LIQUID BAND (GAUZE/BANDAGES/DRESSINGS) ×1 IMPLANT
OCCLUDER COLPOPNEUMO (BALLOONS) ×1 IMPLANT
PACK ROBOT WH (CUSTOM PROCEDURE TRAY) ×2 IMPLANT
PACK ROBOTIC GOWN (GOWN DISPOSABLE) ×2 IMPLANT
PAD POSITIONER PINK NONSTERILE (MISCELLANEOUS) ×2 IMPLANT
PAD PREP 24X48 CUFFED NSTRL (MISCELLANEOUS) ×4 IMPLANT
SET CYSTO W/LG BORE CLAMP LF (SET/KITS/TRAYS/PACK) ×2 IMPLANT
SET IRRIG TUBING LAPAROSCOPIC (IRRIGATION / IRRIGATOR) ×2 IMPLANT
SET TRI-LUMEN FLTR TB AIRSEAL (TUBING) ×1 IMPLANT
SPONGE LAP 4X18 X RAY DECT (DISPOSABLE) ×1 IMPLANT
STRIP CLOSURE SKIN 1/4X4 (GAUZE/BANDAGES/DRESSINGS) ×2 IMPLANT
SUT VIC AB 0 CT1 27 (SUTURE) ×4
SUT VIC AB 0 CT1 27XBRD ANBCTR (SUTURE) ×2 IMPLANT
SUT VIC AB 0 CT1 27XBRD ANTBC (SUTURE) IMPLANT
SUT VICRYL 0 27 CT2 27 ABS (SUTURE) ×10 IMPLANT
SUT VICRYL 0 UR6 27IN ABS (SUTURE) ×2 IMPLANT
SUT VICRYL RAPIDE 4/0 PS 2 (SUTURE) ×4 IMPLANT
SUT VLOC 180 2-0 6IN GS21 (SUTURE) ×1 IMPLANT
SUT VLOC 180 2-0 9IN GS21 (SUTURE) ×1 IMPLANT
SYR 50ML LL SCALE MARK (SYRINGE) ×4 IMPLANT
TIP RUMI ORANGE 6.7MMX12CM (TIP) ×1 IMPLANT
TIP UTERINE 5.1X6CM LAV DISP (MISCELLANEOUS) IMPLANT
TIP UTERINE 6.7X10CM GRN DISP (MISCELLANEOUS) IMPLANT
TIP UTERINE 6.7X6CM WHT DISP (MISCELLANEOUS) IMPLANT
TIP UTERINE 6.7X8CM BLUE DISP (MISCELLANEOUS) IMPLANT
TOWEL OR 17X24 6PK STRL BLUE (TOWEL DISPOSABLE) ×6 IMPLANT
TROCAR 12M 150ML BLUNT (TROCAR) ×2 IMPLANT
TROCAR DISP BLADELESS 8 DVNC (TROCAR) ×1 IMPLANT
TROCAR DISP BLADELESS 8MM (TROCAR) ×1
TROCAR PORT AIRSEAL 5X120 (TROCAR) ×1 IMPLANT
TROCAR XCEL 12X100 BLDLESS (ENDOMECHANICALS) IMPLANT
TROCAR XCEL NON-BLD 11X100MML (ENDOMECHANICALS) ×2 IMPLANT
WARMER LAPAROSCOPE (MISCELLANEOUS) ×2 IMPLANT
WATER STERILE IRR 1000ML POUR (IV SOLUTION) ×6 IMPLANT

## 2014-10-22 NOTE — H&P (Signed)
Date of Initial H&P: 10/14/2014  History reviewed, patient examined, no change in status, stable for surgery.

## 2014-10-22 NOTE — Progress Notes (Signed)
Day of Surgery 0 Subjective:  pt currently comfortable . Pain is 1 out of 10 after receiving IV dilatudid for upper abdominal pain. No nausea.  Objective: Vital signs in last 24 hours: Temp:  [97.8 F (36.6 C)-98.5 F (36.9 C)] 98.5 F (36.9 C) (05/11 1759) Pulse Rate:  [73-134] 112 (05/11 1759) Resp:  [16-20] 16 (05/11 1759) BP: (104-127)/(53-86) 127/70 mmHg (05/11 1759) SpO2:  [96 %-100 %] 99 % (05/11 1759) Weight:  [217 lb (98.431 kg)] 217 lb (98.431 kg) (05/11 1700) Last BM Date: 10/22/14  Intake/Output from previous day:   Intake/Output this shift: Total I/O In: 2500 [I.V.:2500] Out: 650 [Urine:400; Blood:250]  General appearance: alert, cooperative and no distress Cardio: slightly tachycardia regular rthythm  GI: abdomen soft appropriately tender nondistended hypoactive bowel sounds.  Incisions clean dry and intact.   Lab Results:  No results for input(s): WBC, HGB, HCT, PLT in the last 72 hours. BMET No results for input(s): NA, K, CL, CO2, GLUCOSE, BUN, CREATININE, CALCIUM in the last 72 hours. PT/INR No results for input(s): LABPROT, INR in the last 72 hours. ABG No results for input(s): PHART, HCO3 in the last 72 hours.  Invalid input(s): PCO2, PO2  Studies/Results: No results found.  Anti-infectives: Anti-infectives    Start     Dose/Rate Route Frequency Ordered Stop   10/22/14 0751  ceFAZolin (ANCEF) 2-3 GM-% IVPB SOLR    Comments:  Felipa Eth   : cabinet override      10/22/14 0751 10/22/14 1250   10/22/14 0749  ceFAZolin (ANCEF) IVPB 2 g/50 mL premix  Status:  Discontinued     2 g 100 mL/hr over 30 Minutes Intravenous On call to O.R. 10/22/14 0749 10/22/14 1644      Assessment/Plan: s/p Procedure(s): ROBOTIC ASSISTED  HYSTERECTOMY WITH BILATERAL SALPINGECTOMY (Bilateral) Advance diet   Ambulate within 6 hours  D/c foley at 5am if uop is adequate overnight.  Dr. Simona Huh covering this evening.      Cayle Thunder J. 10/22/2014

## 2014-10-22 NOTE — Transfer of Care (Signed)
Immediate Anesthesia Transfer of Care Note  Patient: Natasha Carson  Procedure(s) Performed: Procedure(s): ROBOTIC ASSISTED  HYSTERECTOMY WITH BILATERAL SALPINGECTOMY (Bilateral)  Patient Location: PACU  Anesthesia Type:General  Level of Consciousness: awake, sedated and patient cooperative  Airway & Oxygen Therapy: Patient Spontanous Breathing and Patient connected to nasal cannula oxygen  Post-op Assessment: Report given to RN and Post -op Vital signs reviewed and stable  Post vital signs: Reviewed and stable  Last Vitals:  Filed Vitals:   10/22/14 0750  BP: 113/86  Pulse: 73  Temp: 36.7 C  Resp: 20    Complications: No apparent anesthesia complications

## 2014-10-22 NOTE — Anesthesia Postprocedure Evaluation (Signed)
  Anesthesia Post-op Note  Patient: Natasha Carson  Procedure(s) Performed: Procedure(s): ROBOTIC ASSISTED  HYSTERECTOMY WITH BILATERAL SALPINGECTOMY (Bilateral) Patient is awake and responsive. Pain and nausea are reasonably well controlled. Vital signs are stable and clinically acceptable. Oxygen saturation is clinically acceptable. There are no apparent anesthetic complications at this time. Patient is ready for discharge.

## 2014-10-22 NOTE — Anesthesia Procedure Notes (Signed)
Procedure Name: Intubation Date/Time: 10/22/2014 8:38 AM Performed by: Tobin Chad Pre-anesthesia Checklist: Patient identified, Timeout performed, Suction available, Emergency Drugs available and Patient being monitored Patient Re-evaluated:Patient Re-evaluated prior to inductionOxygen Delivery Method: Circle system utilized and Simple face mask Preoxygenation: Pre-oxygenation with 100% oxygen Intubation Type: IV induction Ventilation: Mask ventilation without difficulty Laryngoscope Size: Mac and 3 Grade View: Grade II Tube size: 7.0 mm Number of attempts: 1 Airway Equipment and Method: Rigid stylet Placement Confirmation: ETT inserted through vocal cords under direct vision,  breath sounds checked- equal and bilateral and positive ETCO2 Secured at: 22 cm Tube secured with: Tape Dental Injury: Teeth and Oropharynx as per pre-operative assessment

## 2014-10-22 NOTE — Anesthesia Preprocedure Evaluation (Signed)
Anesthesia Evaluation  Patient identified by MRN, date of birth, ID band Patient awake    Reviewed: Allergy & Precautions, NPO status , Patient's Chart, lab work & pertinent test results  History of Anesthesia Complications Negative for: history of anesthetic complications  Airway Mallampati: II  TM Distance: >3 FB Neck ROM: Full    Dental  (+) Teeth Intact   Pulmonary neg shortness of breath, asthma , neg sleep apnea, neg recent URI,  breath sounds clear to auscultation        Cardiovascular negative cardio ROS  Rhythm:Regular     Neuro/Psych negative neurological ROS  negative psych ROS   GI/Hepatic negative GI ROS, Neg liver ROS,   Endo/Other  Morbid obesity  Renal/GU negative Renal ROS     Musculoskeletal   Abdominal   Peds  Hematology negative hematology ROS (+)   Anesthesia Other Findings   Reproductive/Obstetrics                             Anesthesia Physical Anesthesia Plan  ASA: II  Anesthesia Plan: General   Post-op Pain Management:    Induction: Intravenous  Airway Management Planned: Oral ETT  Additional Equipment: None  Intra-op Plan:   Post-operative Plan: Extubation in OR  Informed Consent: I have reviewed the patients History and Physical, chart, labs and discussed the procedure including the risks, benefits and alternatives for the proposed anesthesia with the patient or authorized representative who has indicated his/her understanding and acceptance.   Dental advisory given  Plan Discussed with: CRNA and Surgeon  Anesthesia Plan Comments:         Anesthesia Quick Evaluation

## 2014-10-22 NOTE — Op Note (Addendum)
10/22/2014  3:13 PM  PATIENT:  Natasha Carson  34 y.o. female  PRE-OPERATIVE DIAGNOSIS:  D25.9  Fibroids N92.1   Menorrhagia R10.2  Pelvic Pain  POST-OPERATIVE DIAGNOSIS:  fibroids,menorrhagia  PROCEDURE:  Procedure(s): ROBOTIC ASSISTED  HYSTERECTOMY WITH BILATERAL SALPINGECTOMY (Bilateral)  SURGEON:  Surgeon(s) and Role:    * Thurnell Lose, MD - Assisting    * Christophe Louis, MD - Primary  PHYSICIAN ASSISTANT:   ASSISTANTS: Dr. Thurnell Lose assisted due to the complexity of the surgery and concern for adhesive disease   ANESTHESIA:   general  EBL:  Total I/O In: 2100 [I.V.:2100] Out: 450 [Urine:200; Blood:250]  BLOOD ADMINISTERED:none  DRAINS: Urinary Catheter (Foley)   LOCAL MEDICATIONS USED:  OTHER Ropivacaine   SPECIMEN:  Source of Specimen:  uterus cervix and bilateral fallopian tubes   DISPOSITION OF SPECIMEN:  PATHOLOGY  COUNTS:  YES  TOURNIQUET:  * No tourniquets in log *  DICTATION: .Dragon Dictation  PLAN OF CARE: Admit for overnight observation  PATIENT DISPOSITION:  PACU - hemodynamically stable.   Findings : large fibroid uterus. . Normal fallopian tubes and ovaries.    Delay start of Pharmacological VTE agent (>24 hrs) due to surgical blood loss or risk of bleeding: not applicable  Procedure: The patient was taken to the operating room where she was placed under general anesthesia.Time out was performed. Marland Kitchen She was placed in dorsal lithotomy position and prepped and draped in the usual sterile fashion. A weighted speculum was placed into the vagina. A Deaver was placed anteriorly for retraction. The anterior lip of the cervix was grasped with a single-tooth tenaculum. The vaginal mucosa was injected with 2.5 cc of ropivacaine at the 2/4/ 8 and 10 o'clock positions. The uterus was sounded to 12 cm the cervix was dilated to 6 mm . 0 vicryl suture placed at the 12 and 6:00 positions Of the cervix to facilitate placement of a Ru mi uterine manipulator.  The manipulator was placed without difficulty. Weighted speculum and Deaver were removed .   Attention was turned to the patient's abdomen where a 12 mm trocar was placed 4 cm above the umbilicus. under direct visualization . The pneumoperitoneum was achieved with PCO2 gas. The laparoscope was removed. 60 cc of ropivacaine were injected into the abdominal cavity. The laparoscope was reinserted. An 8 mm trocar was placed in the right upper quadrant 16 centimeters from the umbilicus.later connected to robotic arm #3). An 8MM incision was made in the Right upper quadrant TROCAR WAS PLACED 8 cm from the umbilicus. Later connected to robotic arm #1. An 8 mm incision was made in the left upper quadrant 16 cm from the umbilicus and connected to robot arm #2. Marland Kitchen Attention was turned to the left upper quadrant where a 8 mm midclavicular assistant trocar was placed. ( All incision sites were injected with 10 cc of ropivacaine prior to port placement. )   Once all ports had been placed under direct visualization.The laparoscope was removed and the [place] robotic system was thin right-sided docked. The robotic arms were connected to the corresponding trocars as listed above. The laparoscope was then reinserted. The PK bipolar cautery was placed into port #2. The monopolar scissor placed in the port #1. A prograsp was placed in port #3. All instruments were directed into the pelvis under direct visualization.  Attention was turned to the surgeons console.. The left mesosalpinx and left utero-ovarian ligament was cauterized with PK and excised with scissors. The broad ligament was  cauterized with PK incised with scissors. The round ligament was cauterized with the PK incised with scissors. The anterior leaf of broad ligament was incised along the bladder reflection to the midline.   The right mesosalpinx and right utero-ovarian ligament was cauterized with PK and excised with scissors. The right broad ligament was  cauterized with PK excised scissors. The right round ligament was cauterized with PK and excised with scissors. The broad ligament was incised to the midline. The bladder was dissected off the lower uterine segments of the cervix via sharp and blunt dissection. The bladder was filled in a retrograde fashion with methylene blue and sterile water to help with dissection Of the bladder from the lower uterine segment.   The uterine arteries were skeletonized  bilaterally. They were cauterized with PK and transected. The KOH ring was identified. The anterior colpotomy was performed followed by the posterior colpotomy. . Once the uterus,cervix and bilateral fallopian tubes were completely excised the uterus was removed through the vagina .  Visualization was hindered by redundant vaginal Tissue making removal of the uterus vaginally more challenging.  The pK and scissors were removed and log tip forceps were placed in the port #1 and the cutting needle driver was placed in to port #2.  The patient was noted to have a tear from the vaginal cuff that extended 2-3 cm anteriorly. This was reapproximated with 2-0 v lock suture in a running fashion. The vaginal cuff was then reapproximated in a running fashion with 0 v lock suture.  The pelvis was irrigated. Marland Kitchen Marland Kitchen.Excellent hemostasis was noted. All pelvic pedicles were examined and hemostasis was noted. Both ureters were identified and found to peristals.   Inter seed was placed along the vaginal cuff. All instruments removed from the ports. All ports were removed under direct Visualization. The pneumoperitoneum was released. The fascia of the 12 mm umbilical port was closed with 0 Vicryl . The skin incisions were closed with 4-0 Vicryl and then covered with Derma bond.   Attention was turned to the vagina that was examined due to the vaginal cuff extension. The patient was noted to have active bleeding from the apex of the extension. This defect was reapproximated with  0 vicryl in a running locked fashion. Excellent hemostasis was then noted.   Sponge lap and needle counts were correct x. The patient was awakened from anesthesia and taken to the recovery room in stable condition.

## 2014-10-23 ENCOUNTER — Encounter (HOSPITAL_COMMUNITY): Payer: Self-pay | Admitting: Obstetrics and Gynecology

## 2014-10-23 DIAGNOSIS — N920 Excessive and frequent menstruation with regular cycle: Secondary | ICD-10-CM | POA: Diagnosis not present

## 2014-10-23 LAB — CBC
HCT: 29.7 % — ABNORMAL LOW (ref 36.0–46.0)
Hemoglobin: 10 g/dL — ABNORMAL LOW (ref 12.0–15.0)
MCH: 28.6 pg (ref 26.0–34.0)
MCHC: 33.7 g/dL (ref 30.0–36.0)
MCV: 84.9 fL (ref 78.0–100.0)
Platelets: 343 10*3/uL (ref 150–400)
RBC: 3.5 MIL/uL — ABNORMAL LOW (ref 3.87–5.11)
RDW: 13.2 % (ref 11.5–15.5)
WBC: 12.7 10*3/uL — ABNORMAL HIGH (ref 4.0–10.5)

## 2014-10-23 MED ORDER — DOCUSATE SODIUM 100 MG PO CAPS: 100.0000 mg | ORAL_CAPSULE | Freq: Two times a day (BID) | ORAL | Status: AC

## 2014-10-23 MED ORDER — OXYCODONE-ACETAMINOPHEN 5-325 MG PO TABS: 1.0000 | ORAL_TABLET | Freq: Four times a day (QID) | ORAL | Status: DC | PRN

## 2014-10-23 MED ORDER — ACETAMINOPHEN 325 MG PO TABS
650.0000 mg | ORAL_TABLET | Freq: Four times a day (QID) | ORAL | Status: DC | PRN
  Administered 2014-10-23: 650 mg via ORAL
  Filled 2014-10-23: qty 2

## 2014-10-23 MED ORDER — ACETAMINOPHEN 325 MG PO TABS: 650.0000 mg | ORAL_TABLET | Freq: Four times a day (QID) | ORAL | Status: AC | PRN

## 2014-10-23 NOTE — Progress Notes (Addendum)
GYN NOTE POD #1 Subjective: Pt resting comfortably in bed, reports pain 5/10 and states it is starting to come back.  Foley removed this am and has been up to void x1.  Denies fever/chills/CP/SOB.  +flatus, no BM.  Reports minimal vaginal bleeding.  Tolerating CLD.  No nausea or vomiting.  Of note, pt is unable to take percocet- causes hallucinations.  Objective: Vital signs in last 24 hours: Temp:  [97.8 F (36.6 C)-99.8 F (37.7 C)] 99.8 F (37.7 C) (05/12 0055) Pulse Rate:  [73-134] 121 (05/12 0055) Resp:  [16-20] 18 (05/12 0055) BP: (104-137)/(53-86) 115/73 mmHg (05/12 0055) SpO2:  [94 %-100 %] 94 % (05/12 0055) Weight:  [98.431 kg (217 lb)] 98.431 kg (217 lb) (05/11 1700) Last BM Date: 10/22/14  UOP: 2950cc/8hr  General appearance: alert, cooperative and no distress Cardio: slight tachycardia noted on exam Lungs: CTAB with decreased deep inspiration GI: abdomen soft appropriately tender nondistended +BS.  Incisions clean dry and intact.  Ext: SCDs in place, no calf tenderness bilaterally  Lab Results:  CBC Latest Ref Rng 10/23/2014 10/15/2014 09/19/2013  WBC 4.0 - 10.5 K/uL 12.7(H) 9.0 11.3(H)  Hemoglobin 12.0 - 15.0 g/dL 10.0(L) 12.4 8.7(L)  Hematocrit 36.0 - 46.0 % 29.7(L) 36.3 27.4(L)  Platelets 150 - 400 K/uL 343 369 246     Assessment/Plan: s/p Procedure(s):ROBOTIC ASSISTED  HYSTERECTOMY WITH BILATERAL SALPINGECTOMY POD#1  -Advance to general diet this am -Pain: encourage regular use of tylenol and motrin as needed -Voiding freely without difficulty, adequate UOP -slight tachycardia and low grade temp overnight- no evidence of infection and CBC appropriate for recent surgery.   Suspect tachycardia may be due to atelectasis.  Encourage ambulation and IS use today.  -DVT prophylaxis: SCDs in place, encourage ambulation as tolerated  DISPO: Continue with routine postoperative care.  Plan for discharge home once tachycardia has improved.  Addendum @ 1230:  Patient  report better pain control.  VSS. No tachycardia on exam.  Plan for discharge home. BP 119/69 mmHg  Pulse 97  Temp(Src) 98.8 F (37.1 C) (Oral)  Resp 16  Ht 5\' 1"  (1.549 m)  Wt 98.431 kg (217 lb)  BMI 41.02 kg/m2  SpO2 96%  LMP 10/15/2014 (Approximate)  Breastfeeding? Yes   Janyth Pupa, DO [phone] (pager) [phone] (office)

## 2014-10-23 NOTE — Progress Notes (Signed)
Pt out in wheelchair teaching complete  

## 2014-10-23 NOTE — Anesthesia Postprocedure Evaluation (Signed)
  Anesthesia Post-op Note  Patient: Natasha Carson  Procedure(s) Performed: Procedure(s): ROBOTIC ASSISTED  HYSTERECTOMY WITH BILATERAL SALPINGECTOMY (Bilateral)  Patient Location: Women's Unit  Anesthesia Type:General  Level of Consciousness: awake, alert , oriented and patient cooperative  Airway and Oxygen Therapy: Patient Spontanous Breathing  Post-op Pain: none  Post-op Assessment: Post-op Vital signs reviewed, Patient's Cardiovascular Status Stable, Respiratory Function Stable, Patent Airway and No signs of Nausea or vomiting  Post-op Vital Signs: Reviewed and stable  Last Vitals:  Filed Vitals:   10/23/14 0055  BP: 115/73  Pulse: 121  Temp: 37.7 C  Resp: 18    Complications: No apparent anesthesia complications

## 2014-10-23 NOTE — Discharge Instructions (Addendum)
Robotic  Hysterectomy, Care After Refer to this sheet in the next few weeks. These instructions provide you with information on caring for yourself after your procedure. Your health care provider may also give you more specific instructions. Your treatment has been planned according to current medical practices, but problems sometimes occur. Call your health care provider if you have any problems or questions after your procedure. WHAT TO EXPECT AFTER THE PROCEDURE After your procedure, it is typical to have the following:  Abdominal pain. You will be given pain medicine to control it.  Sore throat from the breathing tube that was inserted during surgery. HOME CARE INSTRUCTIONS  Only take over-the-counter or prescription medicines for pain, discomfort, or fever as directed by your health care provider.  The pain medication may cause constipation, you may take colace twice daily as needed (stool softener)  Do not take aspirin. It can cause bleeding.  Do not drive when taking pain medicine.  Follow your health care provider's advice regarding diet, exercise, lifting, driving, and general activities.  Resume your usual diet as directed and allowed.  Get plenty of rest and sleep.  Do not douche, use tampons, or have sexual intercourse for at least 6 weeks, or until your health care provider gives you permission.  Change your bandages (dressings) as directed by your health care provider.  Monitor your temperature and notify your health care provider of a fever.  Take showers instead of baths for 2-3 weeks.  Do not drink alcohol until your health care provider gives you permission.  If you develop constipation, you may take a mild laxative with your health care provider's permission. Bran foods may help with constipation problems. Drinking enough fluids to keep your urine clear or pale yellow may help as well.  Try to have someone home with you for 1-2 weeks to help around the  house.  Keep all of your follow-up appointments as directed by your health care provider. SEEK MEDICAL CARE IF:   You have swelling, redness, or increasing pain around your incision sites.  You have pus coming from your incision.  You notice a bad smell coming from your incision.  Your incision breaks open.  You feel dizzy or lightheaded.  You have pain or bleeding when you urinate.  You have persistent diarrhea.  You have persistent nausea and vomiting.  You have abnormal vaginal discharge.  You have a rash.  You have any type of abnormal reaction or develop an allergy to your medicine.  You have poor pain control with your prescribed medicine. SEEK IMMEDIATE MEDICAL CARE IF:   You have a fever.  You have severe abdominal pain.  You have chest pain.  You have shortness of breath.  You faint.  You have pain, swelling, or redness in your leg.  You have heavy vaginal bleeding with blood clots. MAKE SURE YOU:  Understand these instructions.  Will watch your condition.  Will get help right away if you are not doing well or get worse. Document Released: 05/19/2011 Document Revised: 06/04/2013 Document Reviewed: 12/13/2012 [facility] Patient Information 2015 [place], [place]. This information is not intended to replace advice given to you by your health care provider. Make sure you discuss any questions you have with your health care provider.

## 2014-10-23 NOTE — Addendum Note (Signed)
Addendum  created 10/23/14 0736 by Raenette Rover, CRNA   Modules edited: Notes Section   Notes Section:  File: [id number]

## 2014-11-04 ENCOUNTER — Other Ambulatory Visit (HOSPITAL_COMMUNITY): Payer: Self-pay | Admitting: Obstetrics and Gynecology

## 2014-11-05 NOTE — Discharge Summary (Signed)
Physician Discharge Summary  Patient ID: Natasha Carson MRN: [id number] DOB/AGE: 33-Dec-1982 69 y.o. Admission Date 10/22/2014 Discharge Date 10/23/2014 Admission Diagnoses: 1. Uterine Fibroids 2. Menorrhagia 3. Pelvic pain   Discharge Diagnoses:  Active Problems:   S/P laparoscopic hysterectomy   Discharged Condition: stable  Hospital Course: Pt was admitted for 24 hour observation after robotic assisted laporoscopic hysterectomy with bilateral salingectomy. She did well postoperatively with return of bowel and bladder function on pod #1.   Consults: None  Significant Diagnostic Studies: labs: hgb 10/23/2014 was 10.1  Treatments: surgery: robotic assisted laporoscopic hysterectomy with bilateral salingectomy    Disposition: 01-Home or Self Care     Medication List    TAKE these medications        acetaminophen 325 MG tablet  Commonly known as:  TYLENOL  Take 2 tablets (650 mg total) by mouth every 6 (six) hours as needed for moderate pain.     albuterol 108 (90 BASE) MCG/ACT inhaler  Commonly known as:  PROVENTIL HFA;VENTOLIN HFA  Inhale 2 puffs into the lungs every 6 (six) hours as needed for wheezing.     calcium carbonate 500 MG chewable tablet  Commonly known as:  TUMS - dosed in mg elemental calcium  Chew 1 tablet by mouth daily as needed for indigestion or heartburn.     docusate sodium 100 MG capsule  Commonly known as:  COLACE  Take 1 capsule (100 mg total) by mouth 2 (two) times daily.     ibuprofen 600 MG tablet  Commonly known as:  ADVIL,MOTRIN  Take 1 tablet (600 mg total) by mouth every 6 (six) hours as needed for moderate pain.           Follow-up Information    Follow up with Catha Brow., MD In 2 weeks.   Specialty:  Obstetrics and Gynecology   Contact information:   [address] [place] [address] [phone]       Signed: Catha Brow. 11/05/2014, 2:16 PM

## 2015-06-02 IMAGING — US US FETAL BPP W/O NONSTRESS
1 series · 13 of 23 positions shown · non-contrast
Comparison: none

[Series 1: us fetal bpp w/o nonstress · non-contrast · 23 acquisitions, 13 frames shown]
[im 1/23]
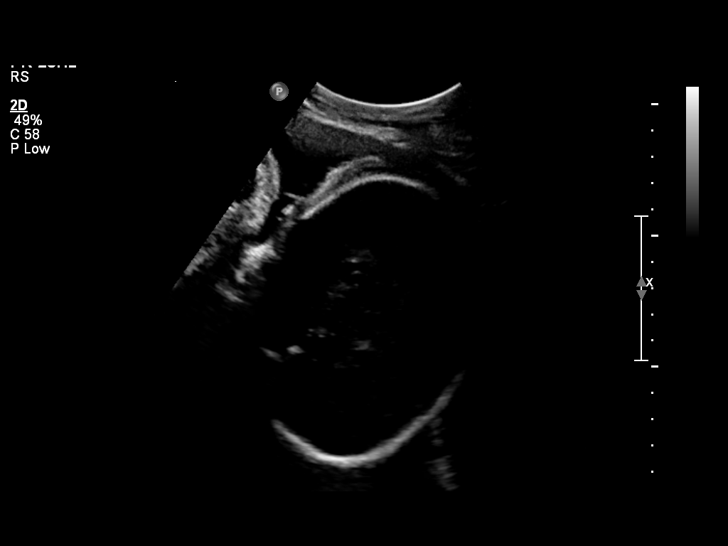
[im 3/23]
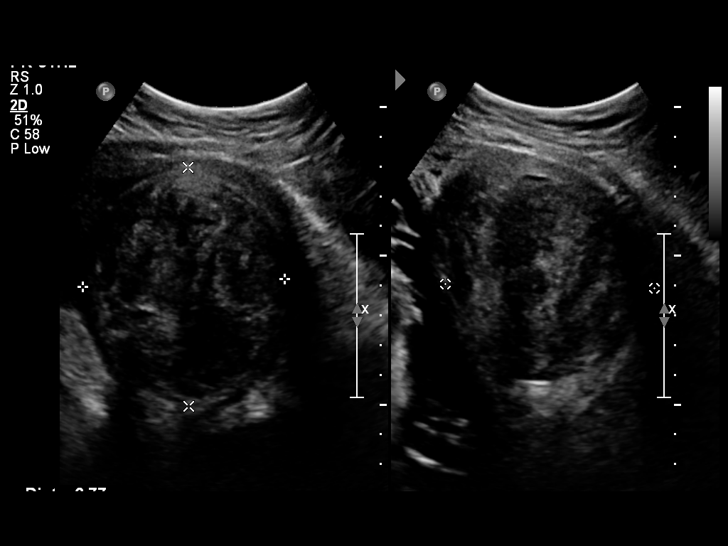
[im 5/23]
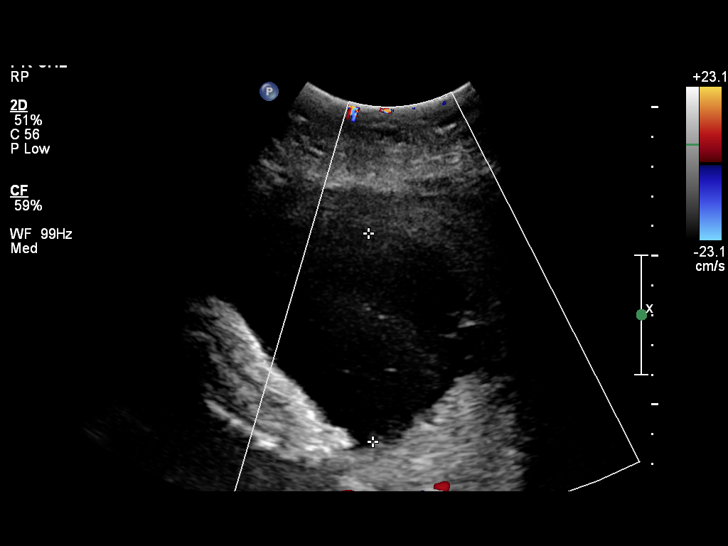
[im 7/23]
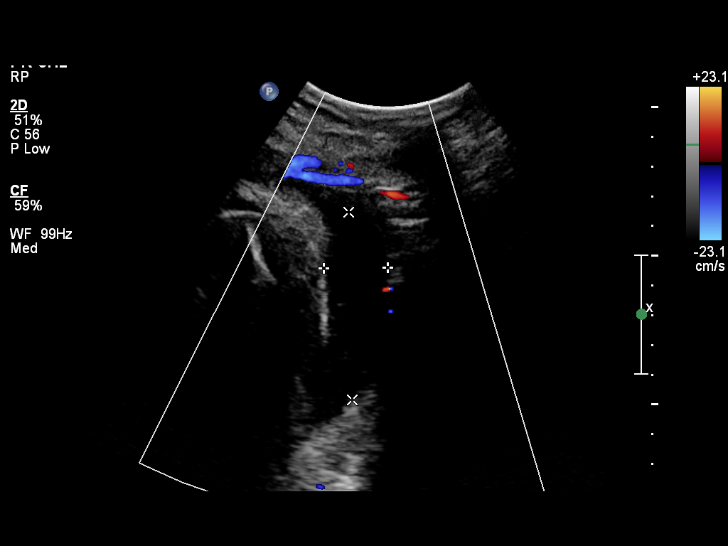
[im 8/23]
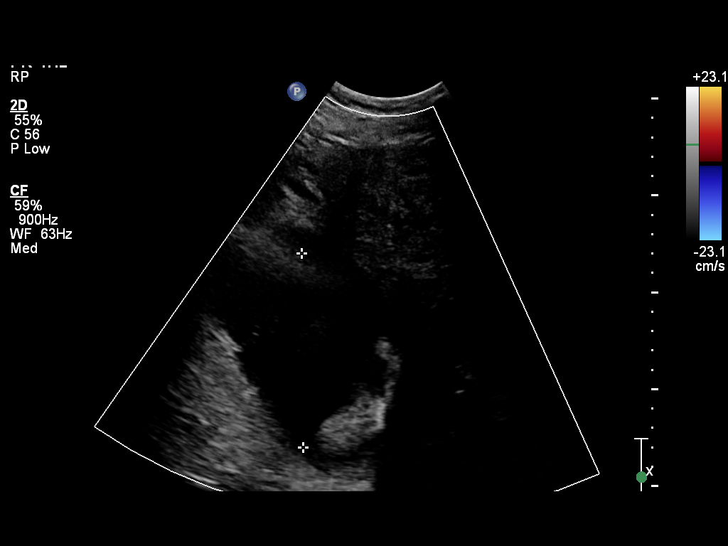
[im 10/23]
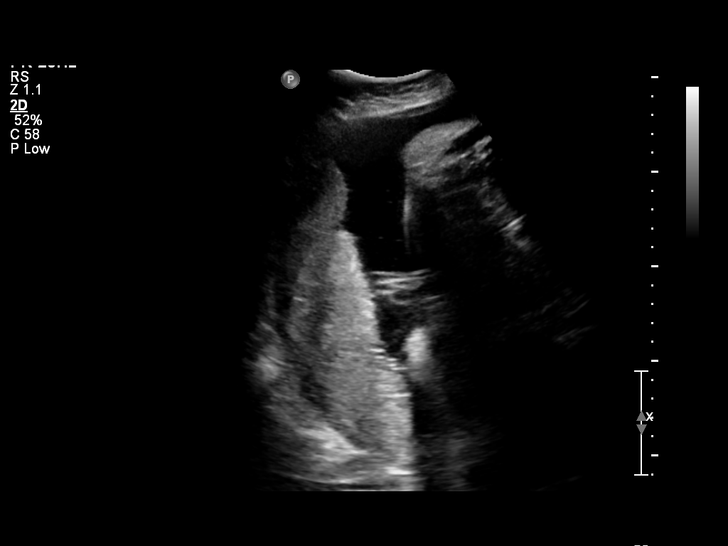
[im 12/23]
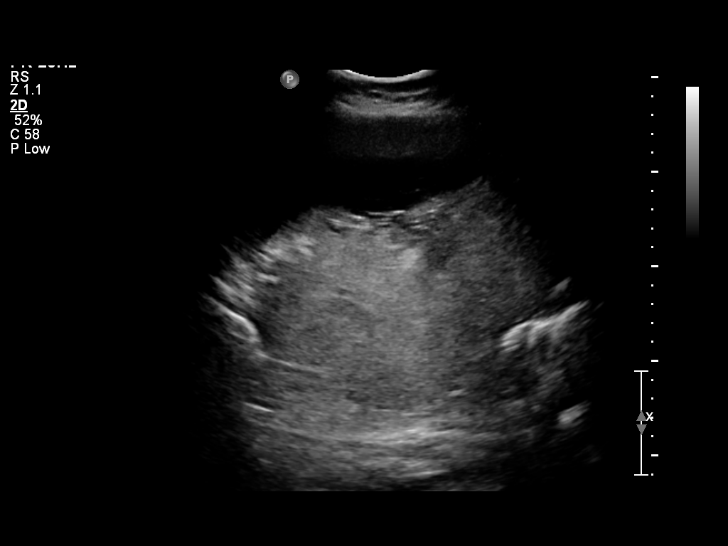
[im 14/23]
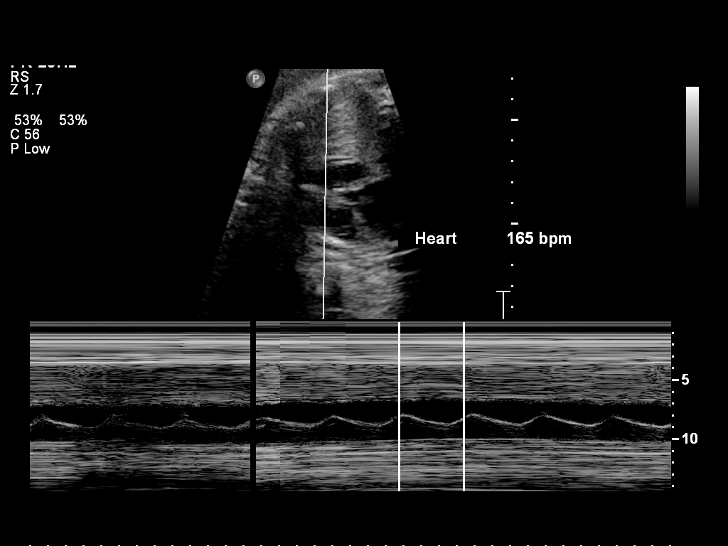
[im 16/23]
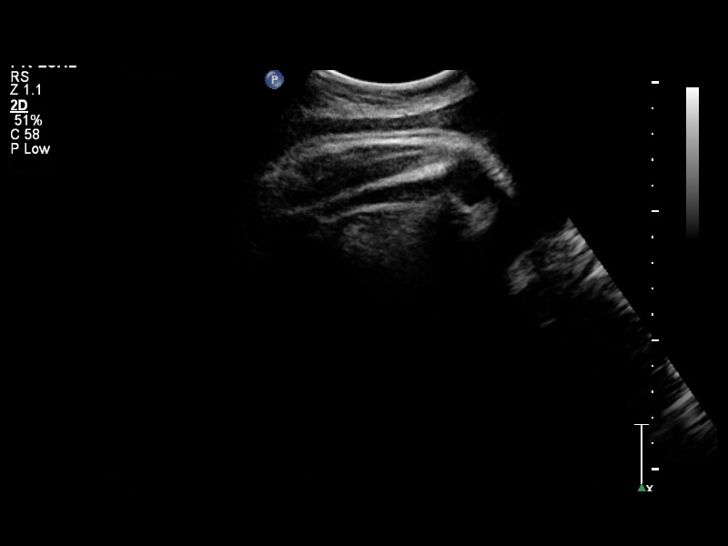
[im 17/23]
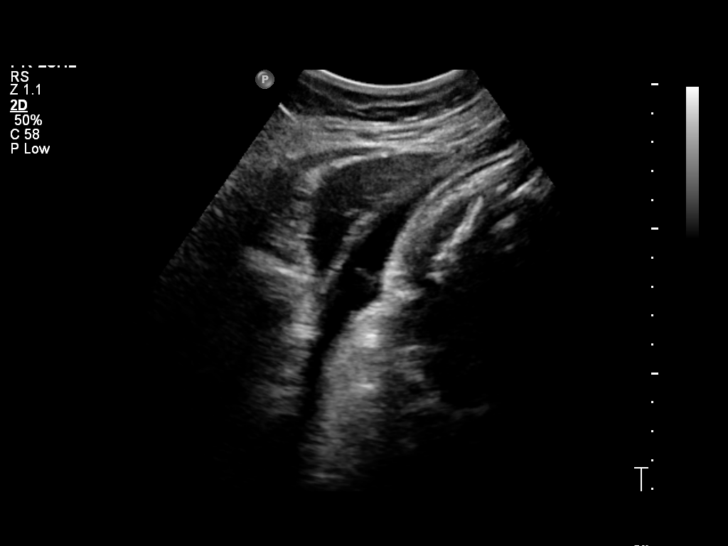
[im 19/23]
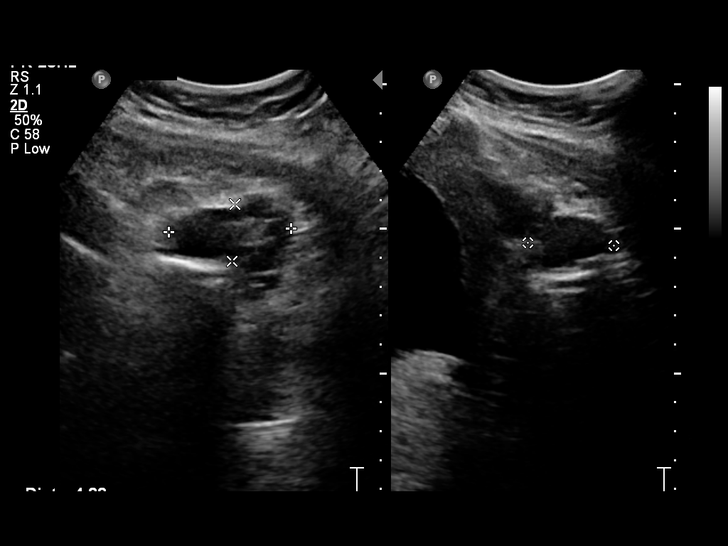
[im 21/23]
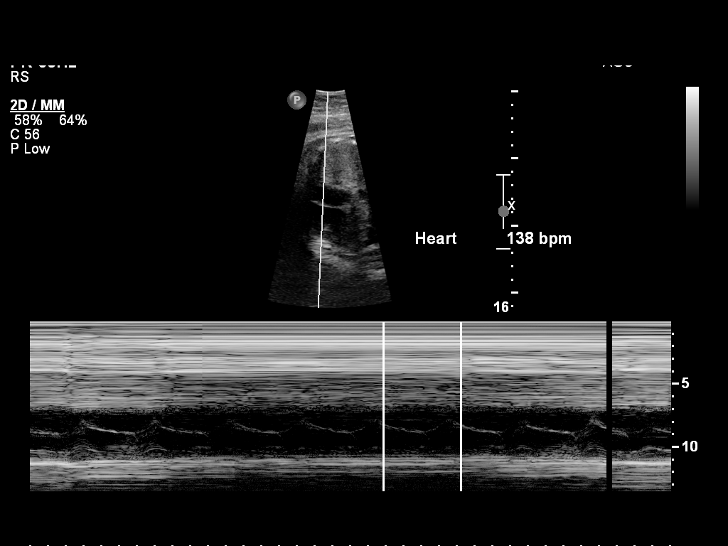
[im 23/23]
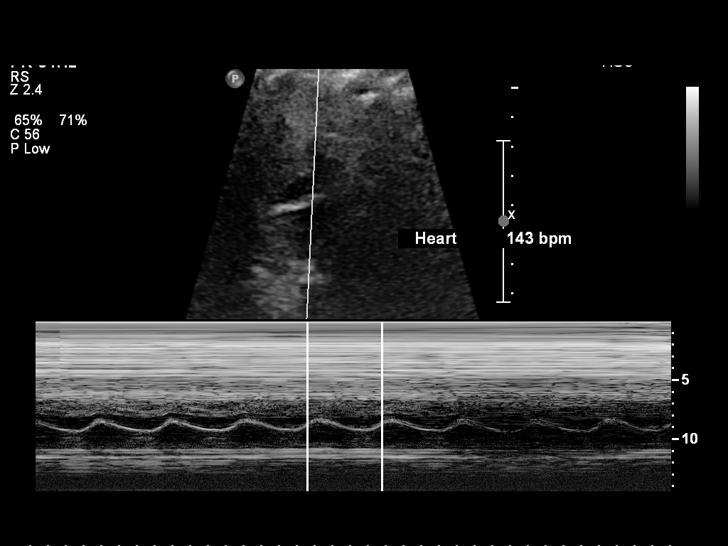

[13 of 23 positions shown; findings below may reference images not displayed]

OBSTETRICS REPORT
                      (Signed Final 09/18/2013 [DATE])

Service(s) Provided

 [HOSPITAL]                                         76815.0
Indications

 Non-reactive NST
 Decreased fetal movement
 Uterine fibroids
Fetal Evaluation

 Num Of Fetuses:    1
 Fetal Heart Rate:  165                          bpm
 Cardiac Activity:  Observed
 Presentation:      Cephalic
 Placenta:          Posterior Fundal, above
                    cervical os
 P. Cord            Not well visualized
 Insertion:

 Amniotic Fluid
 AFI FV:      Polyhydramnios
 AFI Sum:     31.48   cm     > 97  %Tile     Larg Pckt:    9.97  cm
 RUQ:   8.24    cm   RLQ:    6.29   cm    LUQ:   6.98    cm   LLQ:    9.97   cm
Biophysical Evaluation

 Amniotic F.V:   Polyhydramnios             F. Tone:        Observed
 F. Movement:    Observed                   Score:          [DATE]
 F. Breathing:   Not Observed
Gestational Age

 LMP:           38w 1d        Date:  12/24/12                 EDD:   09/30/13
 Best:          38w 1d     Det. By:  LMP  (12/24/12)          EDD:   09/30/13
Cervix Uterus Adnexa

 Cervix:       Not visualized (advanced GA >81wks)
 Uterus:       No abnormality visualized.
 Cul De Sac:   No free fluid seen.

 Left Ovary:    Within normal limits.
 Right Ovary:   Not visualized.
 Adnexa:     No abnormality visualized.
Myomas

 Site                     L(cm)      W(cm)       D(cm)      Location
 LUS                      8          7

 Blood Flow                  RI       PI       Comments

Impression

 Single IUP at 38w 1d
 Large lower uterine segment myoma as noted above
 BPP [DATE] (-2 for absent breathing movement)
 Polyhydramnios noted with an AFI of 31.5 cm
Recommendations

 Please coorelate BPP with NST.  If NST is non-reactive,
 would recommend moving toward delivery given current
 gestational age.
 If NST is reactive / reassuring, would continue antepartum
 fetal testing as appropriate.

## 2017-02-08 DIAGNOSIS — Z01419 Encounter for gynecological examination (general) (routine) without abnormal findings: Secondary | ICD-10-CM | POA: Diagnosis not present

## 2018-03-16 DIAGNOSIS — Z23 Encounter for immunization: Secondary | ICD-10-CM | POA: Diagnosis not present

## 2018-03-16 DIAGNOSIS — Z0000 Encounter for general adult medical examination without abnormal findings: Secondary | ICD-10-CM | POA: Diagnosis not present

## 2018-03-16 DIAGNOSIS — J45909 Unspecified asthma, uncomplicated: Secondary | ICD-10-CM | POA: Diagnosis not present

## 2018-03-29 DIAGNOSIS — Z01419 Encounter for gynecological examination (general) (routine) without abnormal findings: Secondary | ICD-10-CM | POA: Diagnosis not present

## 2019-03-22 DIAGNOSIS — Z0000 Encounter for general adult medical examination without abnormal findings: Secondary | ICD-10-CM | POA: Diagnosis not present

## 2019-04-01 DIAGNOSIS — Z833 Family history of diabetes mellitus: Secondary | ICD-10-CM | POA: Diagnosis not present

## 2019-04-01 DIAGNOSIS — Z13220 Encounter for screening for lipoid disorders: Secondary | ICD-10-CM | POA: Diagnosis not present

## 2019-04-01 DIAGNOSIS — Z131 Encounter for screening for diabetes mellitus: Secondary | ICD-10-CM | POA: Diagnosis not present

## 2019-04-01 DIAGNOSIS — Z23 Encounter for immunization: Secondary | ICD-10-CM | POA: Diagnosis not present

## 2019-04-01 DIAGNOSIS — Z01419 Encounter for gynecological examination (general) (routine) without abnormal findings: Secondary | ICD-10-CM | POA: Diagnosis not present

## 2019-04-11 DIAGNOSIS — Z20828 Contact with and (suspected) exposure to other viral communicable diseases: Secondary | ICD-10-CM | POA: Diagnosis not present

## 2019-04-24 DIAGNOSIS — Z7189 Other specified counseling: Secondary | ICD-10-CM | POA: Diagnosis not present

## 2019-04-24 DIAGNOSIS — Z20828 Contact with and (suspected) exposure to other viral communicable diseases: Secondary | ICD-10-CM | POA: Diagnosis not present

## 2019-09-05 ENCOUNTER — Ambulatory Visit: Payer: Self-pay | Attending: Family

## 2019-09-05 DIAGNOSIS — Z23 Encounter for immunization: Secondary | ICD-10-CM

## 2019-09-05 NOTE — Progress Notes (Signed)
   Covid-19 Vaccination Clinic  Name:  Natasha Carson    MRN: [id number] DOB: May 16, 1981  09/05/2019  [name] was observed post Covid-19 immunization for 15 minutes without incident. She was provided with Vaccine Information Sheet and instruction to access the V-Safe system.   [name] was instructed to call 911 with any severe reactions post vaccine: Marland Kitchen Difficulty breathing  . Swelling of face and throat  . A fast heartbeat  . A bad rash all over body  . Dizziness and weakness   Immunizations Administered    Name Date Dose VIS Date Route   Moderna COVID-19 Vaccine 09/05/2019 12:19 PM 0.5 mL 05/14/2019 Intramuscular   Manufacturer: Moderna   Lot: [id number]   [id number]

## 2019-10-08 ENCOUNTER — Ambulatory Visit: Payer: Self-pay | Attending: Family

## 2019-10-08 DIAGNOSIS — Z23 Encounter for immunization: Secondary | ICD-10-CM

## 2019-10-08 NOTE — Progress Notes (Signed)
   Covid-19 Vaccination Clinic  Name:  Natasha Carson    MRN: [id number] DOB: 1980/08/19  10/08/2019  [name] was observed post Covid-19 immunization for 15 minutes without incident. She was provided with Vaccine Information Sheet and instruction to access the V-Safe system.   [name] was instructed to call 911 with any severe reactions post vaccine: Marland Kitchen Difficulty breathing  . Swelling of face and throat  . A fast heartbeat  . A bad rash all over body  . Dizziness and weakness   Immunizations Administered    Name Date Dose VIS Date Route   Moderna COVID-19 Vaccine 10/08/2019 10:40 AM 0.5 mL 05/2019 Intramuscular   Manufacturer: Moderna   Lot: [id number]   [place][id number]

## 2020-03-26 DIAGNOSIS — Z0000 Encounter for general adult medical examination without abnormal findings: Secondary | ICD-10-CM | POA: Diagnosis not present

## 2020-03-26 DIAGNOSIS — Z833 Family history of diabetes mellitus: Secondary | ICD-10-CM | POA: Diagnosis not present

## 2020-03-26 DIAGNOSIS — Z13220 Encounter for screening for lipoid disorders: Secondary | ICD-10-CM | POA: Diagnosis not present

## 2020-03-26 DIAGNOSIS — Z23 Encounter for immunization: Secondary | ICD-10-CM | POA: Diagnosis not present

## 2020-04-06 DIAGNOSIS — Z01419 Encounter for gynecological examination (general) (routine) without abnormal findings: Secondary | ICD-10-CM | POA: Diagnosis not present

## 2020-04-23 ENCOUNTER — Ambulatory Visit: Payer: Self-pay | Attending: Internal Medicine

## 2020-04-23 DIAGNOSIS — Z23 Encounter for immunization: Secondary | ICD-10-CM

## 2020-04-23 NOTE — Progress Notes (Signed)
   Covid-19 Vaccination Clinic  Name:  Natasha Carson    MRN: [id number] DOB: Aug 02, 1980  04/23/2020  Natasha Carson was observed post Covid-19 immunization for 15 minutes without incident. She was provided with Vaccine Information Sheet and instruction to access the V-Safe system.   Natasha Carson was instructed to call 911 with any severe reactions post vaccine: Marland Kitchen Difficulty breathing  . Swelling of face and throat  . A fast heartbeat  . A bad rash all over body  . Dizziness and weakness

## 2020-09-28 DIAGNOSIS — E559 Vitamin D deficiency, unspecified: Secondary | ICD-10-CM | POA: Diagnosis not present

## 2021-01-04 DIAGNOSIS — R7302 Impaired glucose tolerance (oral): Secondary | ICD-10-CM | POA: Diagnosis not present

## 2021-01-14 DIAGNOSIS — R635 Abnormal weight gain: Secondary | ICD-10-CM | POA: Diagnosis not present

## 2021-01-14 DIAGNOSIS — E7800 Pure hypercholesterolemia, unspecified: Secondary | ICD-10-CM | POA: Diagnosis not present

## 2021-01-14 DIAGNOSIS — E559 Vitamin D deficiency, unspecified: Secondary | ICD-10-CM | POA: Diagnosis not present

## 2021-01-14 DIAGNOSIS — N951 Menopausal and female climacteric states: Secondary | ICD-10-CM | POA: Diagnosis not present

## 2021-01-19 DIAGNOSIS — E669 Obesity, unspecified: Secondary | ICD-10-CM | POA: Diagnosis not present

## 2021-01-19 DIAGNOSIS — Z6841 Body Mass Index (BMI) 40.0 and over, adult: Secondary | ICD-10-CM | POA: Diagnosis not present

## 2021-01-19 DIAGNOSIS — N951 Menopausal and female climacteric states: Secondary | ICD-10-CM | POA: Diagnosis not present

## 2021-01-19 DIAGNOSIS — E7800 Pure hypercholesterolemia, unspecified: Secondary | ICD-10-CM | POA: Diagnosis not present

## 2021-01-26 DIAGNOSIS — Z6841 Body Mass Index (BMI) 40.0 and over, adult: Secondary | ICD-10-CM | POA: Diagnosis not present

## 2021-01-26 DIAGNOSIS — E7800 Pure hypercholesterolemia, unspecified: Secondary | ICD-10-CM | POA: Diagnosis not present

## 2021-01-26 DIAGNOSIS — E669 Obesity, unspecified: Secondary | ICD-10-CM | POA: Diagnosis not present

## 2021-03-02 DIAGNOSIS — N951 Menopausal and female climacteric states: Secondary | ICD-10-CM | POA: Diagnosis not present

## 2021-03-02 DIAGNOSIS — R5382 Chronic fatigue, unspecified: Secondary | ICD-10-CM | POA: Diagnosis not present

## 2021-03-05 DIAGNOSIS — N951 Menopausal and female climacteric states: Secondary | ICD-10-CM | POA: Diagnosis not present

## 2021-03-05 DIAGNOSIS — R5382 Chronic fatigue, unspecified: Secondary | ICD-10-CM | POA: Diagnosis not present

## 2021-03-05 DIAGNOSIS — Z6841 Body Mass Index (BMI) 40.0 and over, adult: Secondary | ICD-10-CM | POA: Diagnosis not present

## 2021-03-05 DIAGNOSIS — E669 Obesity, unspecified: Secondary | ICD-10-CM | POA: Diagnosis not present

## 2021-03-16 DIAGNOSIS — F4320 Adjustment disorder, unspecified: Secondary | ICD-10-CM | POA: Diagnosis not present

## 2021-03-30 DIAGNOSIS — Z131 Encounter for screening for diabetes mellitus: Secondary | ICD-10-CM | POA: Diagnosis not present

## 2021-03-30 DIAGNOSIS — Z0000 Encounter for general adult medical examination without abnormal findings: Secondary | ICD-10-CM | POA: Diagnosis not present

## 2021-03-30 DIAGNOSIS — Z23 Encounter for immunization: Secondary | ICD-10-CM | POA: Diagnosis not present

## 2021-03-30 DIAGNOSIS — R7303 Prediabetes: Secondary | ICD-10-CM | POA: Diagnosis not present

## 2021-03-30 DIAGNOSIS — Z13220 Encounter for screening for lipoid disorders: Secondary | ICD-10-CM | POA: Diagnosis not present

## 2021-04-08 DIAGNOSIS — Z01419 Encounter for gynecological examination (general) (routine) without abnormal findings: Secondary | ICD-10-CM | POA: Diagnosis not present

## 2021-04-08 DIAGNOSIS — F4320 Adjustment disorder, unspecified: Secondary | ICD-10-CM | POA: Diagnosis not present

## 2021-04-16 DIAGNOSIS — F4320 Adjustment disorder, unspecified: Secondary | ICD-10-CM | POA: Diagnosis not present

## 2021-04-23 DIAGNOSIS — F4320 Adjustment disorder, unspecified: Secondary | ICD-10-CM | POA: Diagnosis not present

## 2021-05-14 DIAGNOSIS — E559 Vitamin D deficiency, unspecified: Secondary | ICD-10-CM | POA: Diagnosis not present

## 2021-05-14 DIAGNOSIS — N951 Menopausal and female climacteric states: Secondary | ICD-10-CM | POA: Diagnosis not present

## 2021-05-14 DIAGNOSIS — E038 Other specified hypothyroidism: Secondary | ICD-10-CM | POA: Diagnosis not present

## 2021-05-18 DIAGNOSIS — R232 Flushing: Secondary | ICD-10-CM | POA: Diagnosis not present

## 2021-05-18 DIAGNOSIS — M2550 Pain in unspecified joint: Secondary | ICD-10-CM | POA: Diagnosis not present

## 2021-05-18 DIAGNOSIS — Z6841 Body Mass Index (BMI) 40.0 and over, adult: Secondary | ICD-10-CM | POA: Diagnosis not present

## 2021-05-18 DIAGNOSIS — N951 Menopausal and female climacteric states: Secondary | ICD-10-CM | POA: Diagnosis not present

## 2021-05-28 DIAGNOSIS — F4320 Adjustment disorder, unspecified: Secondary | ICD-10-CM | POA: Diagnosis not present

## 2021-06-25 DIAGNOSIS — F4320 Adjustment disorder, unspecified: Secondary | ICD-10-CM | POA: Diagnosis not present

## 2021-07-20 DIAGNOSIS — E559 Vitamin D deficiency, unspecified: Secondary | ICD-10-CM | POA: Diagnosis not present

## 2021-07-20 DIAGNOSIS — N951 Menopausal and female climacteric states: Secondary | ICD-10-CM | POA: Diagnosis not present

## 2021-07-20 DIAGNOSIS — E038 Other specified hypothyroidism: Secondary | ICD-10-CM | POA: Diagnosis not present

## 2021-07-27 DIAGNOSIS — Z6841 Body Mass Index (BMI) 40.0 and over, adult: Secondary | ICD-10-CM | POA: Diagnosis not present

## 2021-07-27 DIAGNOSIS — R6882 Decreased libido: Secondary | ICD-10-CM | POA: Diagnosis not present

## 2021-07-27 DIAGNOSIS — N951 Menopausal and female climacteric states: Secondary | ICD-10-CM | POA: Diagnosis not present

## 2021-07-27 DIAGNOSIS — R232 Flushing: Secondary | ICD-10-CM | POA: Diagnosis not present

## 2021-08-05 DIAGNOSIS — R7303 Prediabetes: Secondary | ICD-10-CM | POA: Diagnosis not present

## 2021-08-05 DIAGNOSIS — B356 Tinea cruris: Secondary | ICD-10-CM | POA: Diagnosis not present

## 2021-10-12 DIAGNOSIS — F4322 Adjustment disorder with anxiety: Secondary | ICD-10-CM | POA: Diagnosis not present

## 2021-10-26 DIAGNOSIS — N951 Menopausal and female climacteric states: Secondary | ICD-10-CM | POA: Diagnosis not present

## 2021-10-26 DIAGNOSIS — E559 Vitamin D deficiency, unspecified: Secondary | ICD-10-CM | POA: Diagnosis not present

## 2021-10-26 DIAGNOSIS — E038 Other specified hypothyroidism: Secondary | ICD-10-CM | POA: Diagnosis not present

## 2021-10-26 DIAGNOSIS — F4322 Adjustment disorder with anxiety: Secondary | ICD-10-CM | POA: Diagnosis not present

## 2021-11-02 DIAGNOSIS — J029 Acute pharyngitis, unspecified: Secondary | ICD-10-CM | POA: Diagnosis not present

## 2021-11-02 DIAGNOSIS — Z6841 Body Mass Index (BMI) 40.0 and over, adult: Secondary | ICD-10-CM | POA: Diagnosis not present

## 2021-11-02 DIAGNOSIS — R232 Flushing: Secondary | ICD-10-CM | POA: Diagnosis not present

## 2021-11-02 DIAGNOSIS — N951 Menopausal and female climacteric states: Secondary | ICD-10-CM | POA: Diagnosis not present

## 2021-11-02 DIAGNOSIS — R6882 Decreased libido: Secondary | ICD-10-CM | POA: Diagnosis not present

## 2021-11-09 DIAGNOSIS — F4322 Adjustment disorder with anxiety: Secondary | ICD-10-CM | POA: Diagnosis not present

## 2021-11-17 DIAGNOSIS — E7800 Pure hypercholesterolemia, unspecified: Secondary | ICD-10-CM | POA: Diagnosis not present

## 2021-11-19 DIAGNOSIS — R5382 Chronic fatigue, unspecified: Secondary | ICD-10-CM | POA: Diagnosis not present

## 2021-11-19 DIAGNOSIS — E559 Vitamin D deficiency, unspecified: Secondary | ICD-10-CM | POA: Diagnosis not present

## 2021-11-19 DIAGNOSIS — E038 Other specified hypothyroidism: Secondary | ICD-10-CM | POA: Diagnosis not present

## 2021-11-19 DIAGNOSIS — R635 Abnormal weight gain: Secondary | ICD-10-CM | POA: Diagnosis not present

## 2021-11-19 DIAGNOSIS — E7800 Pure hypercholesterolemia, unspecified: Secondary | ICD-10-CM | POA: Diagnosis not present

## 2021-11-20 DIAGNOSIS — F4322 Adjustment disorder with anxiety: Secondary | ICD-10-CM | POA: Diagnosis not present

## 2021-11-23 DIAGNOSIS — F4322 Adjustment disorder with anxiety: Secondary | ICD-10-CM | POA: Diagnosis not present

## 2021-11-28 DIAGNOSIS — F4322 Adjustment disorder with anxiety: Secondary | ICD-10-CM | POA: Diagnosis not present

## 2021-12-02 DIAGNOSIS — M2550 Pain in unspecified joint: Secondary | ICD-10-CM | POA: Diagnosis not present

## 2021-12-02 DIAGNOSIS — Z6841 Body Mass Index (BMI) 40.0 and over, adult: Secondary | ICD-10-CM | POA: Diagnosis not present

## 2021-12-02 DIAGNOSIS — E669 Obesity, unspecified: Secondary | ICD-10-CM | POA: Diagnosis not present

## 2021-12-07 DIAGNOSIS — F4325 Adjustment disorder with mixed disturbance of emotions and conduct: Secondary | ICD-10-CM | POA: Diagnosis not present

## 2021-12-21 DIAGNOSIS — T148XXA Other injury of unspecified body region, initial encounter: Secondary | ICD-10-CM | POA: Diagnosis not present

## 2021-12-21 DIAGNOSIS — M549 Dorsalgia, unspecified: Secondary | ICD-10-CM | POA: Diagnosis not present

## 2021-12-22 DIAGNOSIS — E7800 Pure hypercholesterolemia, unspecified: Secondary | ICD-10-CM | POA: Diagnosis not present

## 2021-12-22 DIAGNOSIS — Z6841 Body Mass Index (BMI) 40.0 and over, adult: Secondary | ICD-10-CM | POA: Diagnosis not present

## 2021-12-22 DIAGNOSIS — E669 Obesity, unspecified: Secondary | ICD-10-CM | POA: Diagnosis not present

## 2022-01-26 DIAGNOSIS — Z6841 Body Mass Index (BMI) 40.0 and over, adult: Secondary | ICD-10-CM | POA: Diagnosis not present

## 2022-01-26 DIAGNOSIS — E038 Other specified hypothyroidism: Secondary | ICD-10-CM | POA: Diagnosis not present

## 2022-01-26 DIAGNOSIS — E559 Vitamin D deficiency, unspecified: Secondary | ICD-10-CM | POA: Diagnosis not present

## 2022-02-01 DIAGNOSIS — F4322 Adjustment disorder with anxiety: Secondary | ICD-10-CM | POA: Diagnosis not present

## 2022-02-09 DIAGNOSIS — M2550 Pain in unspecified joint: Secondary | ICD-10-CM | POA: Diagnosis not present

## 2022-02-09 DIAGNOSIS — Z6839 Body mass index (BMI) 39.0-39.9, adult: Secondary | ICD-10-CM | POA: Diagnosis not present

## 2022-02-09 DIAGNOSIS — E559 Vitamin D deficiency, unspecified: Secondary | ICD-10-CM | POA: Diagnosis not present

## 2022-02-09 DIAGNOSIS — N951 Menopausal and female climacteric states: Secondary | ICD-10-CM | POA: Diagnosis not present

## 2022-02-09 DIAGNOSIS — E669 Obesity, unspecified: Secondary | ICD-10-CM | POA: Diagnosis not present

## 2022-02-09 DIAGNOSIS — E038 Other specified hypothyroidism: Secondary | ICD-10-CM | POA: Diagnosis not present

## 2022-02-15 DIAGNOSIS — F4322 Adjustment disorder with anxiety: Secondary | ICD-10-CM | POA: Diagnosis not present

## 2022-02-23 DIAGNOSIS — Z6838 Body mass index (BMI) 38.0-38.9, adult: Secondary | ICD-10-CM | POA: Diagnosis not present

## 2022-02-23 DIAGNOSIS — R232 Flushing: Secondary | ICD-10-CM | POA: Diagnosis not present

## 2022-02-23 DIAGNOSIS — R5382 Chronic fatigue, unspecified: Secondary | ICD-10-CM | POA: Diagnosis not present

## 2022-02-23 DIAGNOSIS — E559 Vitamin D deficiency, unspecified: Secondary | ICD-10-CM | POA: Diagnosis not present

## 2022-03-04 ENCOUNTER — Other Ambulatory Visit (HOSPITAL_COMMUNITY): Payer: Self-pay

## 2022-03-04 MED ORDER — SAXENDA 18 MG/3ML ~~LOC~~ SOPN
PEN_INJECTOR | SUBCUTANEOUS | 5 refills | Status: AC
  Filled 2022-03-04: qty 15, 30d supply, fill #0
  Filled 2022-04-08: qty 15, 30d supply, fill #1

## 2022-03-08 DIAGNOSIS — F4322 Adjustment disorder with anxiety: Secondary | ICD-10-CM | POA: Diagnosis not present

## 2022-03-09 DIAGNOSIS — E038 Other specified hypothyroidism: Secondary | ICD-10-CM | POA: Diagnosis not present

## 2022-03-09 DIAGNOSIS — E559 Vitamin D deficiency, unspecified: Secondary | ICD-10-CM | POA: Diagnosis not present

## 2022-03-09 DIAGNOSIS — E7800 Pure hypercholesterolemia, unspecified: Secondary | ICD-10-CM | POA: Diagnosis not present

## 2022-03-09 DIAGNOSIS — E669 Obesity, unspecified: Secondary | ICD-10-CM | POA: Diagnosis not present

## 2022-03-09 DIAGNOSIS — Z131 Encounter for screening for diabetes mellitus: Secondary | ICD-10-CM | POA: Diagnosis not present

## 2022-03-09 DIAGNOSIS — R5382 Chronic fatigue, unspecified: Secondary | ICD-10-CM | POA: Diagnosis not present

## 2022-03-09 DIAGNOSIS — R635 Abnormal weight gain: Secondary | ICD-10-CM | POA: Diagnosis not present

## 2022-03-22 DIAGNOSIS — F4322 Adjustment disorder with anxiety: Secondary | ICD-10-CM | POA: Diagnosis not present

## 2022-03-23 DIAGNOSIS — E559 Vitamin D deficiency, unspecified: Secondary | ICD-10-CM | POA: Diagnosis not present

## 2022-03-23 DIAGNOSIS — E669 Obesity, unspecified: Secondary | ICD-10-CM | POA: Diagnosis not present

## 2022-04-07 DIAGNOSIS — F4322 Adjustment disorder with anxiety: Secondary | ICD-10-CM | POA: Diagnosis not present

## 2022-04-08 ENCOUNTER — Other Ambulatory Visit (HOSPITAL_COMMUNITY): Payer: Self-pay

## 2022-04-12 DIAGNOSIS — Z23 Encounter for immunization: Secondary | ICD-10-CM | POA: Diagnosis not present

## 2022-04-12 DIAGNOSIS — Z13220 Encounter for screening for lipoid disorders: Secondary | ICD-10-CM | POA: Diagnosis not present

## 2022-04-12 DIAGNOSIS — R7303 Prediabetes: Secondary | ICD-10-CM | POA: Diagnosis not present

## 2022-04-12 DIAGNOSIS — Z01419 Encounter for gynecological examination (general) (routine) without abnormal findings: Secondary | ICD-10-CM | POA: Diagnosis not present

## 2022-04-12 DIAGNOSIS — Z6837 Body mass index (BMI) 37.0-37.9, adult: Secondary | ICD-10-CM | POA: Diagnosis not present

## 2022-04-12 DIAGNOSIS — Z0000 Encounter for general adult medical examination without abnormal findings: Secondary | ICD-10-CM | POA: Diagnosis not present

## 2022-04-14 DIAGNOSIS — E669 Obesity, unspecified: Secondary | ICD-10-CM | POA: Diagnosis not present

## 2022-04-14 DIAGNOSIS — E559 Vitamin D deficiency, unspecified: Secondary | ICD-10-CM | POA: Diagnosis not present

## 2022-04-16 ENCOUNTER — Other Ambulatory Visit (HOSPITAL_COMMUNITY): Payer: Self-pay

## 2022-04-25 ENCOUNTER — Other Ambulatory Visit: Payer: Self-pay | Admitting: Obstetrics and Gynecology

## 2022-04-25 ENCOUNTER — Other Ambulatory Visit: Payer: Self-pay

## 2022-04-25 DIAGNOSIS — Z1231 Encounter for screening mammogram for malignant neoplasm of breast: Secondary | ICD-10-CM

## 2022-05-19 DIAGNOSIS — E038 Other specified hypothyroidism: Secondary | ICD-10-CM | POA: Diagnosis not present

## 2022-05-19 DIAGNOSIS — E7800 Pure hypercholesterolemia, unspecified: Secondary | ICD-10-CM | POA: Diagnosis not present

## 2022-05-19 DIAGNOSIS — E669 Obesity, unspecified: Secondary | ICD-10-CM | POA: Diagnosis not present

## 2022-06-22 DIAGNOSIS — E669 Obesity, unspecified: Secondary | ICD-10-CM | POA: Diagnosis not present

## 2022-06-22 DIAGNOSIS — E559 Vitamin D deficiency, unspecified: Secondary | ICD-10-CM | POA: Diagnosis not present

## 2022-06-23 ENCOUNTER — Ambulatory Visit
Admission: RE | Admit: 2022-06-23 | Discharge: 2022-06-23 | Disposition: A | Discharge status: Home / Self Care | Payer: BC Managed Care – PPO | Source: Ambulatory Visit | Attending: Obstetrics and Gynecology | Admitting: Obstetrics and Gynecology

## 2022-06-23 DIAGNOSIS — Z1231 Encounter for screening mammogram for malignant neoplasm of breast: Secondary | ICD-10-CM | POA: Diagnosis not present

## 2022-07-20 DIAGNOSIS — E7800 Pure hypercholesterolemia, unspecified: Secondary | ICD-10-CM | POA: Diagnosis not present

## 2022-07-20 DIAGNOSIS — Z6837 Body mass index (BMI) 37.0-37.9, adult: Secondary | ICD-10-CM | POA: Diagnosis not present

## 2022-07-20 DIAGNOSIS — E038 Other specified hypothyroidism: Secondary | ICD-10-CM | POA: Diagnosis not present

## 2022-08-24 DIAGNOSIS — E038 Other specified hypothyroidism: Secondary | ICD-10-CM | POA: Diagnosis not present

## 2022-08-24 DIAGNOSIS — Z6837 Body mass index (BMI) 37.0-37.9, adult: Secondary | ICD-10-CM | POA: Diagnosis not present

## 2022-08-24 DIAGNOSIS — R5382 Chronic fatigue, unspecified: Secondary | ICD-10-CM | POA: Diagnosis not present

## 2022-08-24 DIAGNOSIS — E559 Vitamin D deficiency, unspecified: Secondary | ICD-10-CM | POA: Diagnosis not present

## 2022-08-24 DIAGNOSIS — N951 Menopausal and female climacteric states: Secondary | ICD-10-CM | POA: Diagnosis not present

## 2022-08-24 DIAGNOSIS — E7800 Pure hypercholesterolemia, unspecified: Secondary | ICD-10-CM | POA: Diagnosis not present

## 2022-09-22 DIAGNOSIS — Z6836 Body mass index (BMI) 36.0-36.9, adult: Secondary | ICD-10-CM | POA: Diagnosis not present

## 2022-09-22 DIAGNOSIS — E7800 Pure hypercholesterolemia, unspecified: Secondary | ICD-10-CM | POA: Diagnosis not present

## 2022-10-20 DIAGNOSIS — Z6835 Body mass index (BMI) 35.0-35.9, adult: Secondary | ICD-10-CM | POA: Diagnosis not present

## 2022-10-20 DIAGNOSIS — E559 Vitamin D deficiency, unspecified: Secondary | ICD-10-CM | POA: Diagnosis not present

## 2022-11-17 DIAGNOSIS — E038 Other specified hypothyroidism: Secondary | ICD-10-CM | POA: Diagnosis not present

## 2022-11-17 DIAGNOSIS — Z6835 Body mass index (BMI) 35.0-35.9, adult: Secondary | ICD-10-CM | POA: Diagnosis not present

## 2022-11-17 DIAGNOSIS — R5382 Chronic fatigue, unspecified: Secondary | ICD-10-CM | POA: Diagnosis not present

## 2022-12-14 DIAGNOSIS — Z6836 Body mass index (BMI) 36.0-36.9, adult: Secondary | ICD-10-CM | POA: Diagnosis not present

## 2022-12-14 DIAGNOSIS — E669 Obesity, unspecified: Secondary | ICD-10-CM | POA: Diagnosis not present

## 2022-12-14 DIAGNOSIS — E559 Vitamin D deficiency, unspecified: Secondary | ICD-10-CM | POA: Diagnosis not present

## 2023-01-18 DIAGNOSIS — E7800 Pure hypercholesterolemia, unspecified: Secondary | ICD-10-CM | POA: Diagnosis not present

## 2023-01-18 DIAGNOSIS — Z6836 Body mass index (BMI) 36.0-36.9, adult: Secondary | ICD-10-CM | POA: Diagnosis not present

## 2023-02-01 ENCOUNTER — Other Ambulatory Visit (HOSPITAL_BASED_OUTPATIENT_CLINIC_OR_DEPARTMENT_OTHER): Payer: Self-pay

## 2023-02-22 DIAGNOSIS — R5382 Chronic fatigue, unspecified: Secondary | ICD-10-CM | POA: Diagnosis not present

## 2023-02-22 DIAGNOSIS — Z6836 Body mass index (BMI) 36.0-36.9, adult: Secondary | ICD-10-CM | POA: Diagnosis not present

## 2023-03-22 DIAGNOSIS — E7800 Pure hypercholesterolemia, unspecified: Secondary | ICD-10-CM | POA: Diagnosis not present

## 2023-03-22 DIAGNOSIS — Z6836 Body mass index (BMI) 36.0-36.9, adult: Secondary | ICD-10-CM | POA: Diagnosis not present

## 2023-03-27 DIAGNOSIS — N951 Menopausal and female climacteric states: Secondary | ICD-10-CM | POA: Diagnosis not present

## 2023-03-27 DIAGNOSIS — E038 Other specified hypothyroidism: Secondary | ICD-10-CM | POA: Diagnosis not present

## 2023-04-03 DIAGNOSIS — N951 Menopausal and female climacteric states: Secondary | ICD-10-CM | POA: Diagnosis not present

## 2023-04-03 DIAGNOSIS — R5382 Chronic fatigue, unspecified: Secondary | ICD-10-CM | POA: Diagnosis not present

## 2023-04-03 DIAGNOSIS — Z6836 Body mass index (BMI) 36.0-36.9, adult: Secondary | ICD-10-CM | POA: Diagnosis not present

## 2023-04-17 DIAGNOSIS — F4322 Adjustment disorder with anxiety: Secondary | ICD-10-CM | POA: Diagnosis not present

## 2023-04-18 DIAGNOSIS — Z23 Encounter for immunization: Secondary | ICD-10-CM | POA: Diagnosis not present

## 2023-04-18 DIAGNOSIS — Z0000 Encounter for general adult medical examination without abnormal findings: Secondary | ICD-10-CM | POA: Diagnosis not present

## 2023-04-18 DIAGNOSIS — Z01419 Encounter for gynecological examination (general) (routine) without abnormal findings: Secondary | ICD-10-CM | POA: Diagnosis not present

## 2023-04-18 DIAGNOSIS — E039 Hypothyroidism, unspecified: Secondary | ICD-10-CM | POA: Diagnosis not present

## 2023-04-18 DIAGNOSIS — R7303 Prediabetes: Secondary | ICD-10-CM | POA: Diagnosis not present

## 2023-04-18 DIAGNOSIS — E7800 Pure hypercholesterolemia, unspecified: Secondary | ICD-10-CM | POA: Diagnosis not present

## 2023-04-24 DIAGNOSIS — F4322 Adjustment disorder with anxiety: Secondary | ICD-10-CM | POA: Diagnosis not present

## 2023-05-01 DIAGNOSIS — F4322 Adjustment disorder with anxiety: Secondary | ICD-10-CM | POA: Diagnosis not present

## 2023-05-04 DIAGNOSIS — Z6836 Body mass index (BMI) 36.0-36.9, adult: Secondary | ICD-10-CM | POA: Diagnosis not present

## 2023-05-04 DIAGNOSIS — E7800 Pure hypercholesterolemia, unspecified: Secondary | ICD-10-CM | POA: Diagnosis not present

## 2023-05-04 DIAGNOSIS — E559 Vitamin D deficiency, unspecified: Secondary | ICD-10-CM | POA: Diagnosis not present

## 2023-05-04 DIAGNOSIS — E669 Obesity, unspecified: Secondary | ICD-10-CM | POA: Diagnosis not present

## 2023-05-15 DIAGNOSIS — F4322 Adjustment disorder with anxiety: Secondary | ICD-10-CM | POA: Diagnosis not present

## 2023-05-30 DIAGNOSIS — F4322 Adjustment disorder with anxiety: Secondary | ICD-10-CM | POA: Diagnosis not present

## 2023-06-01 DIAGNOSIS — E038 Other specified hypothyroidism: Secondary | ICD-10-CM | POA: Diagnosis not present

## 2023-06-01 DIAGNOSIS — Z6836 Body mass index (BMI) 36.0-36.9, adult: Secondary | ICD-10-CM | POA: Diagnosis not present

## 2023-06-13 ENCOUNTER — Other Ambulatory Visit: Payer: Self-pay | Admitting: Obstetrics and Gynecology

## 2023-06-13 DIAGNOSIS — Z0000 Encounter for general adult medical examination without abnormal findings: Secondary | ICD-10-CM

## 2023-06-29 ENCOUNTER — Ambulatory Visit
Admission: RE | Admit: 2023-06-29 | Discharge: 2023-06-29 | Disposition: A | Discharge status: Home / Self Care | Payer: BC Managed Care – PPO | Source: Ambulatory Visit | Attending: Obstetrics and Gynecology | Admitting: Obstetrics and Gynecology

## 2023-06-29 DIAGNOSIS — Z0000 Encounter for general adult medical examination without abnormal findings: Secondary | ICD-10-CM

## 2023-07-11 ENCOUNTER — Emergency Department (HOSPITAL_COMMUNITY): Payer: BC Managed Care – PPO

## 2023-07-11 ENCOUNTER — Encounter (HOSPITAL_COMMUNITY): Payer: Self-pay | Admitting: Emergency Medicine

## 2023-07-11 ENCOUNTER — Emergency Department (HOSPITAL_COMMUNITY)
Admission: EM | Admit: 2023-07-11 | Discharge: 2023-07-11 | Discharge status: Against Medical Advice | Payer: BC Managed Care – PPO | Attending: Emergency Medicine | Admitting: Emergency Medicine

## 2023-07-11 DIAGNOSIS — Z5321 Procedure and treatment not carried out due to patient leaving prior to being seen by health care provider: Secondary | ICD-10-CM | POA: Insufficient documentation

## 2023-07-11 DIAGNOSIS — R079 Chest pain, unspecified: Secondary | ICD-10-CM | POA: Insufficient documentation

## 2023-07-11 DIAGNOSIS — R0602 Shortness of breath: Secondary | ICD-10-CM | POA: Diagnosis present

## 2023-07-11 LAB — BASIC METABOLIC PANEL
Anion gap: 11 (ref 5–15)
BUN: 10 mg/dL (ref 6–20)
CO2: 22 mmol/L (ref 22–32)
Calcium: 9.4 mg/dL (ref 8.9–10.3)
Chloride: 105 mmol/L (ref 98–111)
Creatinine, Ser: 0.4 mg/dL — ABNORMAL LOW (ref 0.44–1.00)
GFR, Estimated: >60 mL/min (ref >60)
Glucose, Bld: 119 mg/dL — ABNORMAL HIGH (ref 70–99)
Potassium: 3.1 mmol/L — ABNORMAL LOW (ref 3.5–5.1)
Sodium: 138 mmol/L (ref 135–145)

## 2023-07-11 LAB — CBC
HCT: 41.4 % (ref 36.0–46.0)
Hemoglobin: 13.7 g/dL (ref 12.0–15.0)
MCH: 29.2 pg (ref 26.0–34.0)
MCHC: 33.1 g/dL (ref 30.0–36.0)
MCV: 88.3 fL (ref 80.0–100.0)
Platelets: 373 10*3/uL (ref 150–400)
RBC: 4.69 MIL/uL (ref 3.87–5.11)
RDW: 12 % (ref 11.5–15.5)
WBC: 7.5 10*3/uL (ref 4.0–10.5)
nRBC: 0 % (ref 0.0–0.2)

## 2023-07-11 LAB — TROPONIN I (HIGH SENSITIVITY): Troponin I (High Sensitivity): <2 ng/L (ref <18)

## 2023-07-11 LAB — CBG MONITORING, ED: Glucose-Capillary: 104 mg/dL — ABNORMAL HIGH (ref 70–99)

## 2023-07-11 NOTE — ED Provider Triage Note (Signed)
Emergency Medicine Provider Triage Evaluation Note  Natasha Carson , a 43 y.o. female  was evaluated in triage.  Pt complains of CP radiates to right arm, burning in nature, started last night while resting, did some cleaning yesterday, CP worse laying down flat was SOB yesterday relieved by inhaler.  Review of Systems  Positive: CP Negative: weakness  Physical Exam  BP (!) 151/108   Pulse (!) 103   Temp 98.1 F (36.7 C) (Oral)   Resp 18   LMP  (LMP Unknown)   SpO2 100%  Gen:   Awake, no distress   Resp:  Normal effort  MSK:   Moves extremities without difficulty  Other:    Medical Decision Making  Medically screening exam initiated at 10:03 AM.  Appropriate orders placed.  Natasha Carson was informed that the remainder of the evaluation will be completed by another provider, this initial triage assessment does not replace that evaluation, and the importance of remaining in the ED until their evaluation is complete.  Denies unilateral calf swelling or tenderness, no prior PE. History of asthma, does not feel like asthma    Smitty Knudsen, PA-C 07/11/23 1004

## 2023-07-11 NOTE — ED Triage Notes (Signed)
Pt here from home with c/o sob that started last night after her husband mopped the floor with Clorox , pt took tow nebs last night and her rescue inhaler around 3 am , nad on arrival

## 2023-07-17 ENCOUNTER — Other Ambulatory Visit: Payer: Self-pay | Admitting: Obstetrics and Gynecology

## 2023-07-17 DIAGNOSIS — R928 Other abnormal and inconclusive findings on diagnostic imaging of breast: Secondary | ICD-10-CM

## 2023-07-27 ENCOUNTER — Ambulatory Visit
Admission: RE | Admit: 2023-07-27 | Discharge: 2023-07-27 | Disposition: A | Discharge status: Home / Self Care | Payer: BC Managed Care – PPO | Source: Ambulatory Visit | Attending: Obstetrics and Gynecology | Admitting: Obstetrics and Gynecology

## 2023-07-27 DIAGNOSIS — R928 Other abnormal and inconclusive findings on diagnostic imaging of breast: Secondary | ICD-10-CM
